# Patient Record
Sex: Female | Born: 1980 | Marital: Single
Health system: Southern US, Community
[De-identification: ages and names within clinical notes are randomized; demographics above are authoritative.]

## PROBLEM LIST (undated history)

## (undated) DIAGNOSIS — T4145XA Adverse effect of unspecified anesthetic, initial encounter: Secondary | ICD-10-CM

## (undated) DIAGNOSIS — F419 Anxiety disorder, unspecified: Secondary | ICD-10-CM

## (undated) DIAGNOSIS — R638 Other symptoms and signs concerning food and fluid intake: Secondary | ICD-10-CM

## (undated) DIAGNOSIS — T8859XA Other complications of anesthesia, initial encounter: Secondary | ICD-10-CM

## (undated) DIAGNOSIS — S42309A Unspecified fracture of shaft of humerus, unspecified arm, initial encounter for closed fracture: Secondary | ICD-10-CM

## (undated) DIAGNOSIS — N926 Irregular menstruation, unspecified: Secondary | ICD-10-CM

## (undated) DIAGNOSIS — I10 Essential (primary) hypertension: Secondary | ICD-10-CM

## (undated) DIAGNOSIS — O99345 Other mental disorders complicating the puerperium: Secondary | ICD-10-CM

## (undated) DIAGNOSIS — R5383 Other fatigue: Secondary | ICD-10-CM

## (undated) DIAGNOSIS — R87619 Unspecified abnormal cytological findings in specimens from cervix uteri: Secondary | ICD-10-CM

## (undated) DIAGNOSIS — F53 Postpartum depression: Secondary | ICD-10-CM

## (undated) DIAGNOSIS — IMO0002 Reserved for concepts with insufficient information to code with codable children: Secondary | ICD-10-CM

## (undated) DIAGNOSIS — Z8619 Personal history of other infectious and parasitic diseases: Secondary | ICD-10-CM

## (undated) DIAGNOSIS — R102 Pelvic and perineal pain: Secondary | ICD-10-CM

## (undated) DIAGNOSIS — N898 Other specified noninflammatory disorders of vagina: Secondary | ICD-10-CM

## (undated) DIAGNOSIS — Z87440 Personal history of urinary (tract) infections: Secondary | ICD-10-CM

## (undated) HISTORY — DX: Other symptoms and signs concerning food and fluid intake: R63.8

## (undated) HISTORY — DX: Personal history of urinary (tract) infections: Z87.440

## (undated) HISTORY — DX: Personal history of other infectious and parasitic diseases: Z86.19

## (undated) HISTORY — DX: Irregular menstruation, unspecified: N92.6

## (undated) HISTORY — DX: Unspecified abnormal cytological findings in specimens from cervix uteri: R87.619

## (undated) HISTORY — DX: Other specified noninflammatory disorders of vagina: N89.8

## (undated) HISTORY — DX: Pelvic and perineal pain: R10.2

## (undated) HISTORY — PX: TUBAL LIGATION: SHX77

## (undated) HISTORY — DX: Essential (primary) hypertension: I10

## (undated) HISTORY — DX: Anxiety disorder, unspecified: F41.9

## (undated) HISTORY — DX: Unspecified fracture of shaft of humerus, unspecified arm, initial encounter for closed fracture: S42.309A

## (undated) HISTORY — PX: WISDOM TOOTH EXTRACTION: SHX21

## (undated) HISTORY — DX: Other fatigue: R53.83

## (undated) HISTORY — PX: OTHER SURGICAL HISTORY: SHX169

## (undated) HISTORY — DX: Other mental disorders complicating the puerperium: O99.345

## (undated) HISTORY — DX: Reserved for concepts with insufficient information to code with codable children: IMO0002

## (undated) HISTORY — PX: TONSILLECTOMY: SUR1361

## (undated) HISTORY — DX: Other complications of anesthesia, initial encounter: T88.59XA

## (undated) HISTORY — DX: Adverse effect of unspecified anesthetic, initial encounter: T41.45XA

## (undated) HISTORY — DX: Postpartum depression: F53.0

---

## 1999-01-10 ENCOUNTER — Encounter: Payer: Self-pay | Admitting: General Surgery

## 1999-01-10 ENCOUNTER — Inpatient Hospital Stay (HOSPITAL_COMMUNITY): Admission: EM | Admit: 1999-01-10 | Discharge: 1999-01-12 | Payer: Self-pay | Admitting: Emergency Medicine

## 1999-01-19 ENCOUNTER — Ambulatory Visit (HOSPITAL_BASED_OUTPATIENT_CLINIC_OR_DEPARTMENT_OTHER): Admission: RE | Admit: 1999-01-19 | Discharge: 1999-01-19 | Payer: Self-pay | Admitting: Orthopedic Surgery

## 1999-03-22 ENCOUNTER — Encounter: Admission: RE | Admit: 1999-03-22 | Discharge: 1999-06-20 | Payer: Self-pay | Admitting: Orthopedic Surgery

## 2002-08-19 ENCOUNTER — Encounter: Payer: Self-pay | Admitting: *Deleted

## 2002-08-19 ENCOUNTER — Inpatient Hospital Stay (HOSPITAL_COMMUNITY): Admission: AD | Admit: 2002-08-19 | Discharge: 2002-08-19 | Payer: Self-pay | Admitting: *Deleted

## 2002-08-21 ENCOUNTER — Inpatient Hospital Stay (HOSPITAL_COMMUNITY): Admission: AD | Admit: 2002-08-21 | Discharge: 2002-08-21 | Payer: Self-pay | Admitting: *Deleted

## 2002-10-06 ENCOUNTER — Ambulatory Visit (HOSPITAL_COMMUNITY): Admission: RE | Admit: 2002-10-06 | Discharge: 2002-10-06 | Payer: Self-pay | Admitting: *Deleted

## 2002-12-23 ENCOUNTER — Ambulatory Visit (HOSPITAL_COMMUNITY): Admission: RE | Admit: 2002-12-23 | Discharge: 2002-12-23 | Payer: Self-pay | Admitting: *Deleted

## 2002-12-23 ENCOUNTER — Encounter: Payer: Self-pay | Admitting: *Deleted

## 2003-02-10 ENCOUNTER — Inpatient Hospital Stay (HOSPITAL_COMMUNITY): Admission: AD | Admit: 2003-02-10 | Discharge: 2003-02-10 | Payer: Self-pay | Admitting: *Deleted

## 2003-03-10 ENCOUNTER — Inpatient Hospital Stay (HOSPITAL_COMMUNITY): Admission: AD | Admit: 2003-03-10 | Discharge: 2003-03-10 | Payer: Self-pay | Admitting: *Deleted

## 2003-03-12 ENCOUNTER — Inpatient Hospital Stay (HOSPITAL_COMMUNITY): Admission: AD | Admit: 2003-03-12 | Discharge: 2003-03-12 | Payer: Self-pay | Admitting: Obstetrics & Gynecology

## 2003-03-12 ENCOUNTER — Encounter: Payer: Self-pay | Admitting: *Deleted

## 2003-03-13 ENCOUNTER — Inpatient Hospital Stay (HOSPITAL_COMMUNITY): Admission: AD | Admit: 2003-03-13 | Discharge: 2003-03-16 | Payer: Self-pay | Admitting: Family Medicine

## 2003-07-25 ENCOUNTER — Emergency Department (HOSPITAL_COMMUNITY): Admission: EM | Admit: 2003-07-25 | Discharge: 2003-07-25 | Payer: Self-pay | Admitting: Emergency Medicine

## 2006-09-04 DIAGNOSIS — R5383 Other fatigue: Secondary | ICD-10-CM

## 2006-09-04 DIAGNOSIS — N926 Irregular menstruation, unspecified: Secondary | ICD-10-CM

## 2006-09-04 HISTORY — DX: Irregular menstruation, unspecified: N92.6

## 2006-09-04 HISTORY — DX: Other fatigue: R53.83

## 2006-10-16 ENCOUNTER — Inpatient Hospital Stay (HOSPITAL_COMMUNITY): Admission: AD | Admit: 2006-10-16 | Discharge: 2006-10-16 | Payer: Self-pay | Admitting: Obstetrics and Gynecology

## 2007-02-22 ENCOUNTER — Inpatient Hospital Stay (HOSPITAL_COMMUNITY): Admission: AD | Admit: 2007-02-22 | Discharge: 2007-02-26 | Payer: Self-pay | Admitting: Obstetrics and Gynecology

## 2007-02-24 ENCOUNTER — Encounter (INDEPENDENT_AMBULATORY_CARE_PROVIDER_SITE_OTHER): Payer: Self-pay | Admitting: Obstetrics and Gynecology

## 2007-02-26 ENCOUNTER — Inpatient Hospital Stay (HOSPITAL_COMMUNITY): Admission: AD | Admit: 2007-02-26 | Discharge: 2007-02-26 | Payer: Self-pay | Admitting: Obstetrics and Gynecology

## 2007-04-05 ENCOUNTER — Ambulatory Visit (HOSPITAL_COMMUNITY): Admission: RE | Admit: 2007-04-05 | Discharge: 2007-04-05 | Payer: Self-pay | Admitting: Obstetrics and Gynecology

## 2007-09-05 DIAGNOSIS — N898 Other specified noninflammatory disorders of vagina: Secondary | ICD-10-CM

## 2007-09-05 HISTORY — DX: Other specified noninflammatory disorders of vagina: N89.8

## 2011-01-17 NOTE — H&P (Signed)
NAMEDORTHY, MAGNUSSEN NO.:  [id number]   MEDICAL RECORD NO.:  [id number]          PATIENT TYPE:  AMB   LOCATION:  SDC                           F[facility]ILITY:  [facility]   PHYSICIAN:  Janine Limbo, M.D.DATE OF BIRTH:  1981-03-24   DATE OF ADMISSION:  04/05/2007  DATE OF DISCHARGE:                              HISTORY & PHYSICAL   HISTORY OF PRESENT ILLNESS:  [name] is a 30 year old female, now  para 1-1-0-2, who presents for laparoscopic tubal cautery.  The patient  has been followed at the [facility]  division of [facility].  The patient had a vaginal  delivery at [redacted] weeks gestation on February 24, 2007.  She would like to  proceed with sterilization.   PAST MEDICAL HISTORY:  1. The patient has a history of obesity.  2. She had her wisdom teeth removed at age 56.  3. She had her tonsils removed at age 39.  4. She had a broken arm at age 50 that was repaired.  5. She had TMJ issues at age 51.   DRUG ALLERGIES:  No known drug allergies.   SOCIAL HISTORY:  The patient denies cigarette use, alcohol use and  recreational drug use.   REVIEW OF SYSTEMS:  Noncontributory.   FAMILY HISTORY:  Noncontributory.   PHYSICAL EXAMINATION:  VITAL SIGNS:  Height is 5 feet, 2 inches.  Weight  is 202 pounds.  HEENT:  Within normal limits.  CHEST:  Clear.  HEART:  Regular rate and rhythm.  BREASTS:  Without masses.  ABDOMEN:  Nontender.  EXTREMITIES:  Grossly normal.  NEUROLOGIC:  Grossly normal.  PELVIC:  External genitalia is normal.  Vagina is normal.  Cervix  nontender.  Uterus is normal size, shape and consistency.  Adnexa - no  masses.   ASSESSMENT:  1. Desires sterilization.  2. Obesity.   PLAN:  The patient will undergo a laparoscopy tubal cautery.  She  understands the indications for her surgical procedure, and she accepts  the risk of, but not limited to, anesthetic complications, bleeding,  infections,  possible damage to the surrounding organs and possible tubal  failure (24 per 1000).      Janine Limbo, M.D.  Electronically Signed     AVS/MEDQ  D:  04/04/2007  T:  04/04/2007  Job:  [id number]

## 2011-01-17 NOTE — H&P (Signed)
NAMEKEMANI, [name] NO.:  [id number]   MEDICAL RECORD NO.:  [id number]          PATIENT TYPE:  INP   LOCATION:  9161                          [facility]ILITY:  [facility]   PHYSICIAN:  Janine Limbo, M.D.DATE OF BIRTH:  1981/07/29   DATE OF ADMISSION:  02/22/2007  DATE OF DISCHARGE:                              HISTORY & PHYSICAL   [name] is a 30 year old gravida 2, para-1-0-0-1 at 28-2/7 weeks by  first trimester ultrasound who presented with a gush of fluid at  approximately 8:30 a.m. with ongoing leak since.  She denies any  definitive UCs, but does have some back pain.  No recent intercourse or  trauma.  Does report positive fetal movement, and fluid is clear.  Pregnancy has been normal for her.:   1. Elevated BMI.  2. Questionable LMP.  3. History of irregular cycles.   PRENATAL LABORATORIES:  Blood type A positive.  Rh antibody negative,  VDRL nonreactive, rubella titer positive, hepatitis B surface antigen  negative, HIV nonreactive.  GC and chlamydia cultures were negative.  Pap was normal in March.  Cystic fibrosis testing was declined.  Hemoglobin upon entering the practice was 13.9.  It was within normal  limits at 27 weeks.  The patient declined first semester screen,  quadruple screen, and cystic fibrosis testing.  Glucola was done this  week and was normal.  EDC of May 16, 2007 was established by first  trimester ultrasound secondary to questionable LMP.   HISTORY OF PRESENT PREGNANCY:  The patient entered care at approximately  13 weeks.  She had had an ultrasound in her first trimester.  She was  treated for BV at that time.  She declined first trimester screen,  quadruple screen, and cystic fibrosis.  She had an ultrasound at 18  weeks with limited anatomy noted.  Placenta was normal.  Cervix was 3.25  cm long.  She had another followup anatomy at two weeks subsequent to  that with full anatomic visualization.  She was having some  stressors at  20 weeks with some infidelity.  She also had some family issues.  She  was placed on Vistaril 50 mg q.i.d. just for anxiety.  She was also  treated for UTI at 23 weeks.  She had her Glucola earlier this week, and  that was normal.   OBSTETRICAL HISTORY:  In 2004 the patient had a vaginal birth of a  female infant, weight 6 pounds 7 ounces at 41-1/2 weeks.  She was in  labor 27 hours.  She did have some issues with retained placenta.  The  patient did have a fever during her labor.  She did have some baby blues  for a few days.   PAST MEDICAL HISTORY:  She reports the usual childhood illnesses.  She  had an occasional bladder infection as a child.  She had a broken left  arm at age 33 and had a steel rod placed.  Other surgeries include  wisdom teeth at age 66, tonsils at age 36.  She did have a fetal  bradycardia following the epidural of her last  baby.  The patient did  develop a fever.  Her only other hospitalizations were for childbirth.   ALLERGIES:  She has no known medication allergies.   FAMILY HISTORY:  Her mother is hypertensive on medication.  Her father  is also hypertensive.  Her mother is diabetic on oral medications.  The  patient's daughter had some thyroid trouble.  The patient's first cousin  had seizures.  Genetic history is unremarkable.   SOCIAL HISTORY:  The patient is married to the father of the baby.  He  is currently not present with her.  There have been some issues in their  relationship during this pregnancy.  The patient is accompanied right  now by her mother and sister.  The patient is Caucasian of the [place]  faith.  She is high school educated.  She is employed as a Immunologist.  Her husband has a high school education.  He is currently a Consulting civil engineer.  She has been followed by the physician service [facility].  She  denies any alcohol, drug or tobacco use during this pregnancy.   PHYSICAL EXAMINATION:  VITAL SIGNS:  Stable.  The  patient is afebrile.  HEENT:  Within normal limits.  LUNGS:  Breath sounds are clear.  HEART:  Regular rate and rhythm without murmur.  BREASTS:  Soft and nontender.  ABDOMEN:  Fundal height is approximately 29 cm.  Abdomen is soft and  nontender.  Fetal heart rate was reactive with no decelerations.  There  was mild irritability noted.  Sterile speculum exam reveals positive  pooling, positive nitrazine, positive fern.  Cervix visually appears  closed and long with clear fluid noted.  Fetus was vertex to a bedside  ultrasound.  EXTREMITIES:  Deep tendon reflexes are 2+ without clubbing.  There is  trace edema noted.   IMPRESSION:  1. Intrauterine pregnancy at 28-2/7 weeks.  2. Premature rupture of membranes.   PLAN:  1. Admit to birthing suite for consult with Dr. Marline Backbone who is      attending physician.  2. IV of LR .  3. Penicillin per TBS protocol.  4. Ultrasound.  5. Betamethasone course.  6. NICU consult.  7. Risks of prematurity and premature birth were reviewed with the      patient and her family.  Questions were answered, and NICU consult      was ordered for further information.      Renaldo Reel Emilee Hero, C.N.M.      Janine Limbo, M.D.  Electronically Signed    VLL/MEDQ  D:  02/22/2007  T:  02/22/2007  Job:  [id number]

## 2011-01-17 NOTE — Op Note (Signed)
NAMECHANDELL, [name]   MEDICAL RECORD NO.:  [id number]          PATIENT TYPE:  AMB   LOCATION:  SDC                           [facility]   PHYSICIAN:  Janine Limbo, M.D.DATE OF BIRTH:  1981/04/25   DATE OF PROCEDURE:  04/05/2007  DATE OF DISCHARGE:                               OPERATIVE REPORT   PREOPERATIVE DIAGNOSES:  1. Desires sterilization.  2. Obesity (weight is 202 pounds; height is 5 feet 2 inches).   POSTOPERATIVE DIAGNOSIS:  1. Desires sterilization.  2. Obesity (weight is 202 pounds; height is 5 feet 2 inches).   PROCEDURE:  Laparoscopic tubal cautery.   SURGEON:  Dr. Leonard Schwartz.   FIRST ASSISTANT:  None.   ANESTHETIC:  Is general.   DISPOSITION:  Vickie [name] is a 30 year old female, para 1-1-0-2, who  presents with the above-mentioned diagnoses.  She understands the  indications for her surgical procedure, and she accepts the risks of,  but not limited to, anesthetic complications, bleeding, infection,  possible damage to surrounding organs, and possible tubal failure (24  per 1000).   FINDINGS:  The uterus, fallopian tubes and ovaries were normal.  The  upper abdomen appeared normal as best we could tell.  There was a large  amount of adipose tissue, so it was difficult to visualize most of the  upper abdomen.  There was no damage that we could appreciate to the  bowel or other vital pelvic structures.   PROCEDURE:  The patient was taken to the operating room where a general  anesthetic was given.  The patient's abdomen, perineum and vagina were  prepped with multiple layers of Betadine.  The bladder was drained of  urine.  Examination under anesthesia was performed.  A Hulka tenaculum  was placed inside the uterus.  The patient was sterilely draped.  The  subumbilical area was injected with 5 mL of half percent Marcaine with  epinephrine.  An incision was made, and the Veress needle was  inserted  into the abdominal cavity without difficulty.  Proper placement was  confirmed using the saline drop test.  A pneumoperitoneum was obtained.  The laparoscopic trocar and laparoscope were substituted for the Veress  needle.  The pelvic structures were visualized with findings as  mentioned above.  The right fallopian tube was identified and followed  to its fimbriated end.  The proximal portion of the right fallopian tube  was cauterized in several segments using the bipolar cautery.  Hemostasis was noted to be adequate.  An identical procedure was carried  out on the opposite side.  The pelvis was inspected for a final time,  and then the pneumoperitoneum was allowed to escape.  The trocar was  removed.  The fascia was closed at the umbilicus using 0 Vicryl.  The  skin was reapproximated using 3-0 Monocryl.  Sponge, needle and  instrument counts were correct on 2 occasions.  The estimated blood loss  was less than 5 mL.  The patient tolerated her procedure well.  She was  awakened from her anesthetic without difficulty and taken to  the  recovery room in stable condition.   FOLLOW-UP INSTRUCTIONS:  The patient will return to see Dr. Stefano Gaul in  2-3 weeks for follow-up examination.  She was given a copy of the  postoperative instruction sheet as prepared by the [facility] for patients who have undergone laparoscopy.  The patient was  given Motrin, and she will take 600 mg every 6 hours as needed for mild  to moderate pain.  She will take Vicodin 1 or 2 tablets every 4 hours as  needed for severe pain.  She will call for questions or concerns.      Janine Limbo, M.D.  Electronically Signed     AVS/MEDQ  D:  04/05/2007  T:  04/05/2007  Job:  [id number]

## 2011-01-17 NOTE — Discharge Summary (Signed)
Vickie [name], Vickie [name] NO.:  [id number]   MEDICAL RECORD NO.:  [id number]          PATIENT TYPE:  INP   LOCATION:  9304                          [facility]ILITY:  [facility]   PHYSICIAN:  Janine Limbo, M.D.DATE OF BIRTH:  May 15, 1981   DATE OF ADMISSION:  02/22/2007  DATE OF DISCHARGE:  02/26/2007                               DISCHARGE SUMMARY   ADMISSION DIAGNOSIS:  1. Intrauterine pregnancy at 28-2/7 weeks.  2. Premature rupture of membranes.   DISCHARGE DIAGNOSES:  1. Intrauterine pregnancy at 28-2/7 weeks.  2. Premature rupture of membranes.  3. Preterm labor and spontaneous vaginal delivery of a preterm infant.      This preterm female infant named Vickie [name], Apgars 3 and 7, weighing 2      pounds 7 ounces who was taken to the NICU.   HOSPITAL PROCEDURES:  1. Electronic fetal monitoring.  2. IV antibiotics.  3. Ultrasound.  4. Betamethasone course.  5. Spontaneous vaginal delivery.  6. Epidural anesthesia.   HOSPITAL COURSE:  The patient was admitted with preterm premature  rupture of membranes. Cervix at that time was closed and long. She was  given betamethasone course, placed on penicillin and ultrasound was done  showing 62 percentile growth and she was placed on observation. The next  day she was doing well, still leaking fluid, not having any  contractions, but later that evening developed some increased pressure  and contractions but declined tocolysis. The next morning she developed  variable decelerations of fetal heart rate. She had some sporadic  contractions and she received her second dose of betamethasone in the  afternoon of February 24, 2007. She developed pressure. Cervix was beginning  to thin out and she delivered that evening of a female infant named Vickie [name]  weighing 2 pounds 7 ounces, Apgars three and seven, estimated blood loss  250 mL.  Baby was taken to NICU and the patient was taken to the women's  unit. On postpartum day #1 she was doing well.   She was bottle-feeding  her infant.  Baby was doing well in the NICU.  She received routine  postpartum care.  Hemoglobin was 10.8. On postpartum day #2 she was  ready to go home.  Vital signs were stable.  Chest was clear.  Heart  rate regular rate and rhythm.  Fundus firm.  Lochia within normal  limits.  Perineum intact.  Extremities within normal limits.  She was  deemed to have received full benefit of hospital stay and was discharged  home.   DISCHARGE MEDICATIONS:  Motrin 600 mg p.o. q.6 hours p.r.n.   DISCHARGE LABS:  White blood cell count 19.2, hemoglobin 10.8, platelets  200.   DISCHARGE INSTRUCTIONS:  Per [facility] handout.  Discharge follow up in 6  weeks or p.r.n.   CONDITION ON DISCHARGE:  Good.      Marie L. Williams, C.N.M.      Janine Limbo, M.D.  Electronically Signed    MLW/MEDQ  D:  02/26/2007  T:  02/26/2007  Job:  [id number]

## 2011-01-23 DIAGNOSIS — IMO0002 Reserved for concepts with insufficient information to code with codable children: Secondary | ICD-10-CM

## 2011-01-23 DIAGNOSIS — R102 Pelvic and perineal pain: Secondary | ICD-10-CM

## 2011-01-23 HISTORY — DX: Reserved for concepts with insufficient information to code with codable children: IMO0002

## 2011-01-23 HISTORY — DX: Pelvic and perineal pain: R10.2

## 2011-06-19 LAB — CBC
HCT: 41.7
MCHC: 33.6
MCV: 89.7
Platelets: 297
RDW: 13.4

## 2011-06-19 LAB — URINALYSIS, ROUTINE W REFLEX MICROSCOPIC
Bilirubin Urine: NEGATIVE
Ketones, ur: NEGATIVE
Specific Gravity, Urine: >1.03 — ABNORMAL HIGH
Urobilinogen, UA: 1

## 2011-06-19 LAB — URINE MICROSCOPIC-ADD ON

## 2011-06-21 LAB — STREP B DNA PROBE: Strep Group B Ag: POSITIVE

## 2011-06-21 LAB — CBC
HCT: 39.3
MCHC: 34.7
MCV: 91.1
Platelets: 246
RBC: 3.4 — ABNORMAL LOW
RBC: 3.92
RDW: 13.2
RDW: 13.2
WBC: 14.9 — ABNORMAL HIGH

## 2011-06-21 LAB — URINALYSIS, ROUTINE W REFLEX MICROSCOPIC
Glucose, UA: NEGATIVE
Specific Gravity, Urine: 1.025

## 2011-06-21 LAB — URINE CULTURE

## 2011-06-21 LAB — DIFFERENTIAL
Lymphs Abs: 1.8
Monocytes Relative: 6
Neutro Abs: 12.1 — ABNORMAL HIGH
Neutrophils Relative %: 81 — ABNORMAL HIGH

## 2011-06-21 LAB — GC/CHLAMYDIA PROBE AMP, GENITAL: Chlamydia, DNA Probe: NEGATIVE

## 2011-06-21 LAB — URINE MICROSCOPIC-ADD ON

## 2012-03-22 ENCOUNTER — Encounter: Payer: Self-pay | Admitting: Obstetrics and Gynecology

## 2012-03-22 ENCOUNTER — Telehealth: Payer: Self-pay

## 2012-03-22 ENCOUNTER — Ambulatory Visit (INDEPENDENT_AMBULATORY_CARE_PROVIDER_SITE_OTHER): Payer: BC Managed Care – PPO | Admitting: Obstetrics and Gynecology

## 2012-03-22 VITALS — BP 120/82 | Resp 16 | Ht 62.0 in | Wt 216.0 lb

## 2012-03-22 DIAGNOSIS — T83511A Infection and inflammatory reaction due to indwelling urethral catheter, initial encounter: Secondary | ICD-10-CM

## 2012-03-22 DIAGNOSIS — N926 Irregular menstruation, unspecified: Secondary | ICD-10-CM

## 2012-03-22 DIAGNOSIS — A499 Bacterial infection, unspecified: Secondary | ICD-10-CM

## 2012-03-22 DIAGNOSIS — N390 Urinary tract infection, site not specified: Secondary | ICD-10-CM

## 2012-03-22 DIAGNOSIS — B9689 Other specified bacterial agents as the cause of diseases classified elsewhere: Secondary | ICD-10-CM | POA: Insufficient documentation

## 2012-03-22 DIAGNOSIS — N760 Acute vaginitis: Secondary | ICD-10-CM

## 2012-03-22 DIAGNOSIS — T83518A Infection and inflammatory reaction due to other urinary catheter, initial encounter: Secondary | ICD-10-CM | POA: Insufficient documentation

## 2012-03-22 LAB — POCT WET PREP (WET MOUNT)
Trichomonas Wet Prep HPF POC: NEGATIVE
WBC, Wet Prep HPF POC: NEGATIVE
pH: 4.5

## 2012-03-22 LAB — POCT URINALYSIS DIPSTICK
Blood, UA: 3
Ketones, UA: NEGATIVE
Spec Grav, UA: 1.02
pH, UA: 7

## 2012-03-22 NOTE — Progress Notes (Addendum)
When did bleeding start: 1 wk ago How  Long: current  How often changing pad/tampon: none only when she wipes  Bleeding Disorders: no Contraception: no Fibroids: no Hormone Therapy: no New Medications: no Menopausal Symptoms: no Vag. Discharge: no Abdominal Pain: yes Increased Stress: no  *possible uti took AZO relieves but sx comes right back    S: Patient presented with symptoms of UTI and describes vaginal spotting and irritation (States prone to UTIs) O: Lungs: Bilat clear      CV: RRR      Abdomen: Soft, and non tender all Quadrants      External Genitalia: normal no swelling or tenderness      Speculum: slight irritation of vaginal walls and noted pink blood in vault and cx surface. Cx well perfused with no signs of irritation.      Wet Prep:  PH 4.0 , pos+ whiff, Clue cells.      Urine: for C&S A:  Cystitis associated with BV and BV P. BV - Tx with Flagyl 500mg s po BID x 7 days     Cystitis - Tx Macrobid 100mg s po BID x 7 days     Vagisil  Cream to external labia for comfort     Cranberry Tablets po daily for Urinary tract maintenance.     To return for Annual - to schedule or PRN                              Vickie [name],CNM.

## 2012-03-22 NOTE — Telephone Encounter (Signed)
Rx called into pt [facility] @ 10:18am as stated in progress note. Flagyl 500 mg PO BID x 7days 0 RF's Macrobid 100mg  PO BID x 7days 0 RF's AZO cranberry tablets PO daily dis #30 tab RF 's X11 Vagisil Cream disp 1 tube 0 RF's apply to labia PRN.  [facility] CMA

## 2012-03-23 LAB — GC/CHLAMYDIA PROBE AMP, GENITAL
Chlamydia, DNA Probe: NEGATIVE
GC Probe Amp, Genital: NEGATIVE

## 2012-03-24 LAB — URINE CULTURE

## 2012-04-02 ENCOUNTER — Telehealth: Payer: Self-pay | Admitting: Obstetrics and Gynecology

## 2012-04-02 NOTE — Telephone Encounter (Signed)
Telephone call from patient.  Concern re symptoms of UTI - no pregnant  Ongoing Symptoms since tx 03/22/12 (Macrobid) Urine culture result reviewed from 03/18/12 - sensitive to Bactrim DS  Tx Bactrim DS po BID  X 7 days and Pyridrium  po TID PRN ( no Refills) Called in Order  For patient - [facility]  Patient to follow up if Symptoms do not resolve.  Earl Gala, CNM/Vicki Emilee Hero, CNM.

## 2012-05-03 ENCOUNTER — Ambulatory Visit: Payer: BC Managed Care – PPO | Admitting: Obstetrics and Gynecology

## 2012-05-29 ENCOUNTER — Telehealth: Payer: Self-pay | Admitting: Obstetrics and Gynecology

## 2012-05-29 NOTE — Telephone Encounter (Signed)
VM from pt RX line. Requesting RX for UTI. Saw midwife previously and discussed standing order. PT [phone]

## 2012-05-29 NOTE — Telephone Encounter (Signed)
Lm on vm to cb per telephone call.  

## 2014-02-12 ENCOUNTER — Other Ambulatory Visit: Payer: Self-pay | Admitting: Obstetrics and Gynecology

## 2014-02-23 ENCOUNTER — Other Ambulatory Visit: Payer: Self-pay | Admitting: Obstetrics and Gynecology

## 2014-07-06 ENCOUNTER — Encounter: Payer: Self-pay | Admitting: Obstetrics and Gynecology

## 2015-02-09 LAB — LIPID PANEL
Cholesterol: 216 — AB (ref 0–200)
HDL: 41 (ref 35–70)
LDL Cholesterol: 142
Triglycerides: 167 — AB (ref 40–160)

## 2015-02-09 LAB — CBC AND DIFFERENTIAL
HCT: 43 (ref 36–46)
HEMOGLOBIN: 14.8 (ref 12.0–16.0)
PLATELETS: 290 (ref 150–399)
WBC: 9.6

## 2015-02-09 LAB — BASIC METABOLIC PANEL
BUN: 12 (ref 4–21)
CREATININE: 0.7 (ref <=1.1)
GLUCOSE: 98
POTASSIUM: 4.4 (ref 3.4–5.3)
Sodium: 141 (ref 137–147)

## 2015-02-09 LAB — HEMOGLOBIN A1C: Hemoglobin A1C: 5.7

## 2015-02-09 LAB — TSH: TSH: 2.59 (ref <=5.90)

## 2015-02-10 LAB — HM PAP SMEAR: HM Pap smear: NORMAL

## 2016-05-17 ENCOUNTER — Ambulatory Visit (HOSPITAL_COMMUNITY)
Admission: RE | Admit: 2016-05-17 | Discharge: 2016-05-17 | Disposition: A | Discharge status: Home / Self Care | Payer: No Typology Code available for payment source | Source: Ambulatory Visit | Attending: Family Medicine | Admitting: Family Medicine

## 2016-05-17 ENCOUNTER — Other Ambulatory Visit: Payer: Self-pay | Admitting: Family Medicine

## 2016-05-17 ENCOUNTER — Encounter (HOSPITAL_COMMUNITY): Payer: Self-pay

## 2016-05-17 DIAGNOSIS — R1032 Left lower quadrant pain: Secondary | ICD-10-CM | POA: Diagnosis not present

## 2016-05-17 DIAGNOSIS — K760 Fatty (change of) liver, not elsewhere classified: Secondary | ICD-10-CM | POA: Diagnosis not present

## 2016-05-25 LAB — HEPATIC FUNCTION PANEL
ALT: 24 (ref 7–35)
AST: 20 (ref 13–35)
Alkaline Phosphatase: 79 (ref 25–125)
BILIRUBIN, TOTAL: 0.4

## 2016-05-25 LAB — HM PAP SMEAR: HM Pap smear: NEGATIVE

## 2017-03-20 ENCOUNTER — Encounter: Payer: Self-pay | Admitting: Family Medicine

## 2017-03-20 ENCOUNTER — Ambulatory Visit: Payer: No Typology Code available for payment source | Admitting: Family Medicine

## 2017-03-20 VITALS — BP 129/86 | HR 84 | Ht 62.25 in | Wt 253.1 lb

## 2017-03-20 DIAGNOSIS — R748 Abnormal levels of other serum enzymes: Secondary | ICD-10-CM

## 2017-03-20 DIAGNOSIS — E66813 Obesity, class 3: Secondary | ICD-10-CM | POA: Insufficient documentation

## 2017-03-20 DIAGNOSIS — Z0000 Encounter for general adult medical examination without abnormal findings: Secondary | ICD-10-CM

## 2017-03-20 DIAGNOSIS — M791 Myalgia, unspecified site: Secondary | ICD-10-CM

## 2017-03-20 DIAGNOSIS — M2550 Pain in unspecified joint: Secondary | ICD-10-CM

## 2017-03-20 DIAGNOSIS — R0683 Snoring: Secondary | ICD-10-CM

## 2017-03-20 DIAGNOSIS — I10 Essential (primary) hypertension: Secondary | ICD-10-CM

## 2017-03-20 DIAGNOSIS — Z8739 Personal history of other diseases of the musculoskeletal system and connective tissue: Secondary | ICD-10-CM

## 2017-03-20 DIAGNOSIS — R609 Edema, unspecified: Secondary | ICD-10-CM

## 2017-03-20 NOTE — Progress Notes (Signed)
New patient office visit note:  Impression and Recommendations:    1. Obesity, Class III, BMI 40-49.9 (morbid obesity) (HCC)   2. HTN, goal below 130/80   3. History of chronic back pain- MRI done in past   4. Peripheral edema   5. Snoring- terrible   6. Elevated liver enzymes   7. gen Myalgias   8. Arthralgia, unspecified joint   9. Routine adult health maintenance      No problem-specific Assessment & Plan notes found for this encounter.   The patient was counseled, risk factors were discussed, anticipatory guidance given.   New Prescriptions   No medications on file     Discontinued Medications   No medications on file      Orders Placed This Encounter  Procedures  . CBC with Differential/Platelet  . Comprehensive metabolic panel  . Hemoglobin A1c  . Lipid panel  . TSH  . T4, free  . VITAMIN D 25 Hydroxy (Vit-D Deficiency, Fractures)  . Magnesium  . Phosphorus     Gross side effects, risk and benefits, and alternatives of medications discussed with patient.  Patient is aware that all medications have potential side effects and we are unable to predict every side effect or drug-drug interaction that may occur.  Expresses verbal understanding and consents to current therapy plan and treatment regimen.  Return for Please follow-up 2 weeks or so for review of blood work and eval of chronic back pain.  Please see AVS handed out to patient at the end of our visit for further patient instructions/ counseling done pertaining to today's office visit.    Note: This document was prepared using Dragon voice recognition software and may include unintentional dictation errors.  ----------------------------------------------------------------------------------------------------------------------    Subjective:     HPI: Vickie [name] is a pleasant 36 y.o. female who presents to [facility] today to review their medical  history with me and establish care.   I asked the patient to review their chronic problem list with me to ensure everything was updated and accurate.    All recent office visits with other providers, any medical records that patient brought in etc  - I reviewed today.     Also asked pt to get me medical records from [facility] providers/ specialists that they had seen within the past 3-5 years- if they are in private practice and/or do not work for a [employer], [facility], [facility], [facility] or [facility] owned practice.  Told them to call their specialists to clarify this if they are not sure.   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     Works full time at [employer]- 40-50hr /wk;  [place] here. Close to parents- they live her.  Sister- close to her as well.   Married 5 kids- 2 step kids and 3 of her own.  Some martial discourse and differing opinions on how to reach children.  They've been married for 5 years.  Obese:  She is the heaviest she has ever been.   She used [employer] and lost 50lbs about 4 yrs ago or so.     Problem  Htn, Goal Below 130/80   About one yr has had HTN.   AT home- bp measurements are off and really high.  No sx.     History of chronic back pain- MRI done in past   MRI about 15 years ago.  Has seen specialists.  Has even  woken up in the middle the night due to back pain. Never had PT.     Peripheral Edema  Snoring- terrible  Obesity, Class III, Bmi 40-49.9 (Morbid Obesity) (Hcc)  Elevated Liver Enzymes  Myalgia  Arthralgia   Bilateral feet, knees, back, neck, and heels- chronic       Wt Readings from Last 3 Encounters:  03/20/17 253 lb 1.6 oz (114.8 kg)  03/22/12 216 lb (98 kg)   BP Readings from Last 3 Encounters:  03/20/17 129/86  03/22/12 120/82   Pulse Readings from Last 3 Encounters:  03/20/17 84   BMI Readings from Last 3 Encounters:  03/20/17 45.92 kg/m  03/22/12 39.51 kg/m    Patient Care Team    Relationship Specialty Notifications Start End    [name], DO PCP - General Family Medicine  03/20/17     Patient Active Problem List   Diagnosis Date Noted  . HTN, goal below 130/80 03/20/2017  . History of chronic back pain- MRI done in past 03/20/2017  . Peripheral edema 03/20/2017  . Snoring- terrible 03/20/2017  . Obesity, Class III, BMI 40-49.9 (morbid obesity) (HCC) 03/20/2017  . Elevated liver enzymes 03/20/2017  . Myalgia 03/20/2017  . Arthralgia 03/20/2017  . BV (bacterial vaginosis) 03/22/2012  . Urinary catheter infection (HCC) 03/22/2012     Past Medical History:  Diagnosis Date  . Abnormal Pap smear   . Anxiety   . Broken arm    Left   . Complication of anesthesia    Epidural- baby's HR decreased ; developed  fever  . Fatigue 2008  . H/O dyspareunia 01/23/11  . H/O varicella   . Hx of candidiasis   . Hx: UTI (urinary tract infection)   . Hypertension   . Increased BMI   . Irregular periods/menstrual cycles 2008  . Postpartum depression   . Vaginal lesion 2009    Painful  . Vaginal pain 01/23/11     Past Medical History:  Diagnosis Date  . Abnormal Pap smear   . Anxiety   . Broken arm    Left   . Complication of anesthesia    Epidural- baby's HR decreased ; developed  fever  . Fatigue 2008  . H/O dyspareunia 01/23/11  . H/O varicella   . Hx of candidiasis   . Hx: UTI (urinary tract infection)   . Hypertension   . Increased BMI   . Irregular periods/menstrual cycles 2008  . Postpartum depression   . Vaginal lesion 2009    Painful  . Vaginal pain 01/23/11     Past Surgical History:  Procedure Laterality Date  . repair broken arm    . TONSILLECTOMY     Age 75  . TUBAL LIGATION    . WISDOM TOOTH EXTRACTION       Family History  Problem Relation Age of Onset  . Hypertension Mother   . Diabetes Mother   . Cancer Mother        skin  . Leukemia Mother   . Diabetes Father   . Hypertension Father   . COPD Father   . Stroke Maternal Grandmother      History  Drug Use No      History  Alcohol Use  . 0.5 oz/week  . 1 Standard drinks or equivalent per week    Comment: social     History  Smoking Status  . Never Smoker  Smokeless Tobacco  . Never Used     Outpatient Encounter Prescriptions  as of 03/20/2017  Medication Sig  . aspirin (BAYER LOW DOSE) 81 MG EC tablet Take 81 mg by mouth daily. Swallow whole.  . hydrochlorothiazide (HYDRODIURIL) 12.5 MG tablet   . pantoprazole (PROTONIX) 40 MG tablet    No facility-administered encounter medications on file as of 03/20/2017.     Allergies: Patient has no known allergies.   Review of Systems  Constitutional: Negative for chills, diaphoresis, fever, malaise/fatigue and weight loss.  HENT: Negative for congestion, sore throat and tinnitus.   Eyes: Negative for blurred vision, double vision and photophobia.  Respiratory: Negative for cough and wheezing.   Cardiovascular: Negative for chest pain and palpitations.  Gastrointestinal: Negative for blood in stool, diarrhea, nausea and vomiting.  Genitourinary: Negative for dysuria, frequency and urgency.  Musculoskeletal: Negative for joint pain and myalgias.  Skin: Negative for itching and rash.  Neurological: Negative for dizziness, focal weakness, weakness and headaches.  Endo/Heme/Allergies: Negative for environmental allergies and polydipsia. Does not bruise/bleed easily.  Psychiatric/Behavioral: Negative for depression and memory loss. The patient is not nervous/anxious and does not have insomnia.      Objective:   Blood pressure 129/86, pulse 84, height 5' 2.25" (1.581 m), weight 253 lb 1.6 oz (114.8 kg), last menstrual period 03/13/2017. Body mass index is 45.92 kg/m. General: Well Developed, well nourished, and in no acute distress.  Neuro: Alert and oriented x3, extra-ocular muscles intact, sensation grossly intact.  HEENT:[place]/AT, PERRLA, neck supple, No carotid bruits Skin: no gross rashes  Cardiac: Regular rate and  rhythm Respiratory: Essentially clear to auscultation bilaterally. Not using accessory muscles, speaking in full sentences.  Abdominal: not grossly distended Musculoskeletal: Ambulates w/o diff, FROM * 4 ext.  Vasc: less 2 sec cap RF, warm and pink  Psych:  No HI/SI, judgement and insight good, Euthymic mood. Full Affect.    No results found for this or any previous visit (from the past 2160 hour(s)).

## 2017-03-20 NOTE — Patient Instructions (Addendum)
Next office visit please return to discuss labs as well as your chronic back pain      Life coach- get one!!!    Behavioral Health/Psychiatry Referrals   Corine Shelter, MSW [address] [place] [phone]   [facility] Caralyn Guile, PhD [address] [facility] [phone]  [facility] Psychological [address] [place], [place] [phone]  Heloise Beecham [facility] and [employer] [phone]  [facility] abuse [facility] Manager [address] [place] [phone]  [facility] [address], [place] [phone] Eliott Nine, PhD [place] Scrape, PhD Hollace Kinnier, LCSW Andrena Mews, PhD-child, adolescent and adults  [facility] [address], Ginette Otto [phone] Daun Peacock, MS-child, adolescent and adults Madelaine Etienne, PhD-adolescent and adults  [facility]rief, terminal illness [address] [place], [place] [phone]  [facility] [address], [place] [phone] [facility] [address] [place] [phone]  [facility] [address] [place] [phone]  [facility] [address], [address] [place] Marcus [phone]  [facility] [place] [facility] [address] Pura Spice [phone]  [facility] [address], [place] [phone]  [facility] [address], [place] [phone]     .Behavior Modification Ideas for Weight Management  Weight management involves adopting a healthy lifestyle that includes a knowledge of nutrition and exercise, a positive attitude and the right  kind of motivation. Internal motives such as better health, increased energy, self-esteem and personal control increase your chances of lifelong weight management success.  Remember to have realistic goals and think long-term success. Believe in yourself and you can do it. The following information will give you ideas to help you meet your goals.  Control Your Home Environment  Eat only while sitting down at the kitchen or dining room table. Do not eat while watching television, reading, cooking, talking on the phone, standing at the refrigerator or working on the computer. Keep tempting foods out of the house - don't buy them. Keep tempting foods out of sight. Have low-calorie foods ready to eat. Unless you are preparing a meal, stay out of the kitchen. Have healthy snacks at your disposal, such as small pieces of fruit, vegetables, canned fruit, pretzels, low-fat string cheese and nonfat cottage cheese.  Control Your Work Environment  Do not eat at [employer] or keep tempting snacks at your desk. If you get hungry between meals, plan healthy snacks and bring them with you to work. During your breaks, go for a walk instead of eating. If you work around food, plan in advance the one item you will eat at mealtime. Make it inconvenient to nibble on food by chewing gum, sugarless candy or drinking water or another low-calorie beverage. Do not work through meals. Skipping meals slows down metabolism and may result in overeating at the next meal. If food is available for special occasions, either pick the healthiest item, nibble on low-fat snacks brought from home, don't have anything offered, choose one option and have a small amount, or have only a beverage.  Control Your Mealtime Environment  Serve your plate of food at the stove or kitchen counter. Do not put the serving dishes on the table. If you do put dishes on the table, remove them immediately when finished eating. Fill half of your  plate with  vegetables, a quarter with lean protein and a quarter with starch. Use smaller plates, bowls and glasses. A smaller portion will look large when it is in a little dish. Politely refuse second helpings. When fixing your plate, limit portions of food to one scoop/serving or less.   Daily Food Management  Replace eating with another activity that you will not associate with food. Wait 20 minutes before eating something you are craving. Drink a large glass of water or diet soda before eating. Always have a big glass or bottle of water to drink throughout the day. Avoid high-calorie add-ons such as cream with your coffee, butter, mayonnaise and salad dressings.  Shopping: Do not shop when hungry or tired. Shop from a list and avoid buying anything that is not on your list. If you must have tempting foods, buy individual-sized packages and try to find a lower-calorie alternative. Don't taste test in the store. Read food labels. Compare products to help you make the healthiest choices.  Preparation: Chew a piece of gum while cooking meals. Use a quarter teaspoon if you taste test your food. Try to only fix what you are going to eat, leaving yourself no chance for seconds. If you have prepared more food than you need, portion it into individual containers and freeze or refrigerate immediately. Don't snack while cooking meals.  Eating: Eat slowly. Remember it takes about 20 minutes for your stomach to send a message to your brain that it is full. Don't let fake hunger make you think you need more. The ideal way to eat is to take a bite, put your utensil down, take a sip of water, cut your next bite, take a bit, put your utensil down and so on. Do not cut your food all at one time. Cut only as needed. Take small bites and chew your food well. Stop eating for a minute or two at least once during a meal or snack. Take breaks to reflect and have conversation.  Cleanup and  Leftovers: Label leftovers for a specific meal or snack. Freeze or refrigerate individual portions of leftovers. Do not clean up if you are still hungry.  Eating Out and Social Eating  Do not arrive hungry. Eat something light before the meal. Try to fill up on low-calorie foods, such as vegetables and fruit, and eat smaller portions of the high-calorie foods. Eat foods that you like, but choose small portions. If you want seconds, wait at least 20 minutes after you have eaten to see if you are actually hungry or if your eyes are bigger than your stomach. Limit alcoholic beverages. Try a soda water with a twist of lime. Do not skip other meals in the day to save room for the special event.  At Restaurants: Order  la carte rather than buffet style. Order some vegetables or a salad for an appetizer instead of eating bread. If you order a high-calorie dish, share it with someone. Try an after-dinner mint with your coffee. If you do have dessert, share it with two or more people. Don't overeat because you do not want to waste food. Ask for a doggie bag to take extra food home. Tell the server to put half of your entree in a to go bag before the meal is served to you. Ask for salad dressing, gravy or high-fat sauces on the side. Dip the tip of your fork in the dressing before each bite. If bread is served, ask for only one piece. Try it plain  without butter or oil. At [employer] where oil and vinegar is served with bread, use only a small amount of oil and a lot of vinegar for dipping.  At a Friend's House: Offer to bring a dish, appetizer or dessert that is low in calories. Serve yourself small portions or tell the host that you only want a small amount. Stand or sit away from the snack table. Stay away from the kitchen or stay busy if you are near the food. Limit your alcohol intake.  At [employer] and Cafeterias: Cover most of your plate with lettuce and/or vegetables. Use a  salad plate instead of a dinner plate. After eating, clear away your dishes before having coffee or tea.  Entertaining at Home: Explore low-fat, low-cholesterol cookbooks. Use single-serving foods like chicken breasts or hamburger patties. Prepare low-calorie appetizers and desserts.   Holidays: Keep tempting foods out of sight. Decorate the house without using food. Have low-calorie beverages and foods on hand for guests. Allow yourself one planned treat a day. Don't skip meals to save up for the holiday feast. Eat regular, planned meals.   Exercise Well  Make exercise a priority and a planned activity in the day. If possible, walk the entire or part of the distance to work. Get an exercise buddy. Go for a walk with a colleague during one of your breaks, go to the gym, run or take a walk with a friend, walk in the mall with a shopping companion. Park at the end of the parking lot and walk to the store or office entrance. Always take the stairs all of the way or at least part of the way to your floor. If you have a desk job, walk around the office frequently. Do leg lifts while sitting at your desk. Do something outside on the weekends like going for a hike or a bike ride.   Have a Healthy Attitude  Make health your weight management priority. Be realistic. Have a goal to achieve a healthier you, not necessarily the lowest weight or ideal weight based on calculations or tables. Focus on a healthy eating style, not on dieting. Dieting usually lasts for a short amount of time and rarely produces long-term success. Think long term. You are developing new healthy behaviors to follow next month, in a year and in a decade.    This information is for educational purposes only and is not intended to replace the advice of your doctor or health care provider. We encourage you to discuss with your doctor any questions or concerns you may have.        Guidelines for Losing  Weight   We want weight loss that will last so you should lose 1-2 pounds a week.  THAT IS IT! Please pick THREE things a month to change. Once it is a habit check off the item. Then pick another three items off the list to become habits.  If you are already doing a habit on the list GREAT!  Cross that item off!  Don't drink your calories. Ie, alcohol, soda, fruit juice, and sweet tea.   Drink more water. Drink a glass when you feel hungry or before each meal.   Eat breakfast - Complex carb and protein (likeDannon light and fit yogurt, oatmeal, fruit, eggs, [place] bacon).  Measure your cereal.  Eat no more than one cup a day. (ie Kashi)  Eat an apple a day.  Add a vegetable a day.  Try a new vegetable a  month.  Use Pam! Stop using oil or butter to cook.  Don't finish your plate or use smaller plates.  Share your dessert.  Eat sugar free Jello for dessert or frozen grapes.  Don't eat 2-3 hours before bed.  Switch to whole wheat bread, pasta, and brown rice.  Make healthier choices when you eat out. No fries!  Pick baked chicken, NOT fried.  Don't forget to SLOW DOWN when you eat. It is not going anywhere.   Take the stairs.  Park far away in the parking lot  Lift soup cans (or weights) for 10 minutes while watching TV.  Walk at work for 10 minutes during break.  Walk outside 1 time a week with your friend, kids, dog, or significant other.  Start a walking group at church.  Walk the mall as much as you can tolerate.   Keep a food diary.  Weigh yourself daily.  Walk for 15 minutes 3 days per week.  Cook at home more often and eat out less. If life happens and you go back to old habits, it is okay.  Just start over. You can do it!  If you experience chest pain, get short of breath, or tired during the exercise, please stop immediately and inform your doctor.    Before you even begin to attack a weight-loss plan, it pays to remember this: You are not fat. You  have fat. Losing weight isn't about blame or shame; it's simply another achievement to accomplish. Dieting is like any other skill-you have to buckle down and work at it. As long as you act in a smart, reasonable way, you'll ultimately get where you want to be. Here are some weight loss pearls for you.   1. It's Not a Diet. It's a Lifestyle Thinking of a diet as something you're on and suffering through only for the short term doesn't work. To shed weight and keep it off, you need to make permanent changes to the way you eat. It's OK to indulge occasionally, of course, but if you cut calories temporarily and then revert to your old way of eating, you'll gain back the weight quicker than you can say yo-yo. Use it to lose it. Research shows that one of the best predictors of long-term weight loss is how many pounds you drop in the first month. For that reason, nutritionists often suggest being stricter for the first two weeks of your new eating strategy to build momentum. Cut out added sugar and alcohol and avoid unrefined carbs. After that, figure out how you can reincorporate them in a way that's healthy and maintainable.  2. There's a Right Way to Exercise Working out burns calories and fat and boosts your metabolism by building muscle. But those trying to lose weight are notorious for overestimating the number of calories they burn and underestimating the amount they take in. Unfortunately, your system is biologically programmed to hold on to extra pounds and that means when you start exercising, your body senses the deficit and ramps up its hunger signals. If you're not diligent, you'll eat everything you burn and then some. Use it, to lose it. Cardio gets all the exercise glory, but strength and interval training are the real heroes. They help you build lean muscle, which in turn increases your metabolism and calorie-burning ability 3. Don't Overreact to Mild Hunger Some people have a hard time losing  weight because of hunger anxiety. To them, being hungry is bad-something to be avoided at  all costs-so they carry snacks with them and eat when they don't need to. Others eat because they're stressed out or bored. While you never want to get to the point of being ravenous (that's when bingeing is likely to happen), a hunger pang, a craving, or the fact that it's 3:00 p.m. should not send you racing for the vending machine or obsessing about the energy bar in your purse. Ideally, you should put off eating until your stomach is growling and it's difficult to concentrate.  Use it to lose it. When you feel the urge to eat, use the HALT method. Ask yourself, Am I really hungry? Or am I angry or anxious, lonely or bored, or tired? If you're still not certain, try the apple test. If you're truly hungry, an apple should seem delicious; if it doesn't, something else is going on. Or you can try drinking water and making yourself busy, if you are still hungry try a healthy snack.  4. Not All Calories Are Created Equal The mechanics of weight loss are pretty simple: Take in fewer calories than you use for energy. But the kind of food you eat makes all the difference. Processed food that's high in saturated fat and refined starch or sugar can cause inflammation that disrupts the hormone signals that tell your brain you're full. The result: You eat a lot more.  Use it to lose it. Clean up your diet. Swap in whole, unprocessed foods, including vegetables, lean protein, and healthy fats that will fill you up and give you the biggest nutritional bang for your calorie buck. In a few weeks, as your brain starts receiving regular hunger and fullness signals once again, you'll notice that you feel less hungry overall and naturally start cutting back on the amount you eat.  5. Protein, Produce, and Plant-Based Fats Are Your Weight-Loss Trinity Here's why eating the three Ps regularly will help you drop pounds. Protein fills you  up. You need it to build lean muscle, which keeps your metabolism humming so that you can torch more fat. People in a weight-loss program who ate double the recommended daily allowance for protein (about 110 grams for a 150-pound woman) lost 70 percent of their weight from fat, while people who ate the RDA lost only about 40 percent, one study found. Produce is packed with filling fiber. "It's very difficult to consume too many calories if you're eating a lot of vegetables. Example: Three cups of broccoli is a lot of food, yet only 93 calories. (Fruit is another story. It can be easy to overeat and can contain a lot of calories from sugar, so be sure to monitor your intake.) Plant-based fats like olive oil and those in avocados and nuts are healthy and extra satiating.  Use it to lose it. Aim to incorporate each of the three Ps into every meal and snack. People who eat protein throughout the day are able to keep weight off, according to a study in the American Journal of Clinical Nutrition. In addition to meat, poultry and seafood, good sources are beans, lentils, eggs, tofu, and yogurt. As for fat, keep portion sizes in check by measuring out salad dressing, oil, and nut butters (shoot for one to two tablespoons). Finally, eat veggies or a little fruit at every meal. People who did that consumed 308 fewer calories but didn't feel any hungrier than when they didn't eat more produce.  7. How You Eat Is As Important As What You Eat In order  for your brain to register that you're full, you need to focus on what you're eating. Sit down whenever you eat, preferably at a table. Turn off the TV or computer, put down your phone, and look at your food. Smell it. Chew slowly, and don't put another bite on your fork until you swallow. When women ate lunch this attentively, they consumed 30 percent less when snacking later than those who listened to an audiobook at lunchtime, according to a study in the [place] Journal of  Nutrition. 8. Weighing Yourself Really Works The scale provides the best evidence about whether your efforts are paying off. Seeing the numbers tick up or down or stagnate is motivation to keep going-or to rethink your approach. A 2015 study at [facility] found that daily weigh-ins helped people lose more weight, keep it off, and maintain that loss, even after two years. Use it to lose it. Step on the scale at the same time every day for the best results. If your weight shoots up several pounds from one weigh-in to the next, don't freak out. Eating a lot of salt the night before or having your period is the likely culprit. The number should return to normal in a day or two. It's a steady climb that you need to do something about. 9. Too Much Stress and Too Little Sleep Are Your Enemies When you're tired and frazzled, your body cranks up the production of cortisol, the stress hormone that can cause carb cravings. Not getting enough sleep also boosts your levels of ghrelin, a hormone associated with hunger, while suppressing leptin, a hormone that signals fullness and satiety. People on a diet who slept only five and a half hours a night for two weeks lost 55 percent less fat and were hungrier than those who slept eight and a half hours, according to a study in the [place] Medical Association Journal. Use it to lose it. Prioritize sleep, aiming for seven hours or more a night, which research shows helps lower stress. And make sure you're getting quality zzz's. If a snoring spouse or a fidgety cat wakes you up frequently throughout the night, you may end up getting the equivalent of just four hours of sleep, according to a study from [facility]. Keep pets out of the bedroom, and use a white-noise app to drown out snoring. 10. You Will Hit a plateau-And You Can Bust Through It As you slim down, your body releases much less leptin, the fullness hormone.  If you're not strength training, start  right now. Building muscle can raise your metabolism to help you overcome a plateau. To keep your body challenged and burning calories, incorporate new moves and more intense intervals into your workouts or add another sweat session to your weekly routine. Alternatively, cut an extra 100 calories or so a day from your diet. Now that you've lost weight, your body simply doesn't need as much fuel.    Since food equals calories, in order to lose weight you must either eat fewer calories, exercise more to burn off calories with activity, or both. Food that is not used to fuel the body is stored as fat. A major component of losing weight is to make smarter food choices. Here's how:  1)   Limit non-nutritious foods, such as: Sugar, honey, syrups and candy Pastries, donuts, pies, cakes and cookies Soft drinks, sweetened juices and alcoholic beverages  2)  Cut down on high-fat foods by: - Choosing poultry, fish or lean  red meat - Choosing low-fat cooking methods, such as baking, broiling, steaming, grilling and boiling - Using low-fat or non-fat dairy products - Using vinaigrette, herbs, lemon or fat-free salad dressings - Avoiding fatty meats, such as bacon, sausage, franks, ribs and luncheon meats - Avoiding high-fat snacks like nuts, chips and chocolate - Avoiding fried foods - Using less butter, margarine, oil and mayonnaise - Avoiding high-fat gravies, cream sauces and cream-based soups  3) Eat a variety of foods, including: - Fruit and vegetables that are raw, steamed or baked - Whole grains, breads, cereal, rice and pasta - Dairy products, such as low-fat or non-fat milk or yogurt, low-fat cottage cheese and low-fat cheese - Protein-rich foods like chicken, Mal[place]urkey, fish, lean meat and legumes, or beans  4) Change your eating habits by: - Eat three balanced meals a day to help control your hunger - Watch portion sizes and eat small servings of a variety of foods - Choose low-calorie  snacks - Eat only when you are hungry and stop when you are satisfied - Eat slowly and try not to perform other tasks while eating - Find other activities to distract you from food, such as walking, taking up a hobby or being involved in the community - Include regular exercise in your daily routine ( minimum of 20 min of moderate-intensity exercise at least 5 days/week)  - Find a support group, if necessary, for emotional support in your weight loss journey           Easy ways to cut 100 calories   1. Eat your eggs with hot sauce OR salsa instead of cheese.  Eggs are great for breakfast, but many people consider eggs and cheese to be BFFs. Instead of cheese-1 oz. of cheddar has 114 calories-top your eggs with hot sauce, which contains no calories and helps with satiety and metabolism. Salsa is also a great option!!  2. Top your toast, waffles or pancakes with fresh berries instead of jelly or syrup. Half a cup of berries-fresh, frozen or thawed-has about 40 calories, compared with 2 tbsp. of maple syrup or jelly, which both have about 100 calories. The berries will also give you a good punch of fiber, which helps keep you full and satisfied and won't spike blood sugar quickly like the jelly or syrup. 3. Swap the non-fat latte for black coffee with a splash of half-and-half. Contrary to its name, that non-fat latte has 130 calories and a startling 19g of carbohydrates per 16 oz. serving. Replacing that 'light' drinkable dessert with a black coffee with a splash of half-and-half saves you more than 100 calories per 16 oz. serving. 4. Sprinkle salads with freeze-dried raspberries instead of dried cranberries. If you want a sweet addition to your nutritious salad, stay away from dried cranberries. They have a whopping 130 calories per  cup and 30g carbohydrates. Instead, sprinkle freeze-dried raspberries guilt-free and save more than 100 calories per  cup serving, adding 3g of belly-filling  fiber. 5. Go for mustard in place of mayo on your sandwich. Mustard can add really nice flavor to any sandwich, and there are tons of varieties, from spicy to honey. A serving of mayo is 95 calories, versus 10 calories in a serving of mustard.  Or try an avocado mayo spread: You can find the recipe few click this link: https://www.californiaavocado.com/recipes/recipe-container/california-avocado-mayo 6. Choose a DIY salad dressing instead of the store-bought kind. Mix Dijon or whole grain mustard with low-fat Kefir or red wine vinegar and garlic.  7. Use hummus as a spread instead of a dip. Use hummus as a spread on a high-fiber cracker or tortilla with a sandwich and save on calories without sacrificing taste. 8. Pick just one salad "accessory." Salad isn't automatically a calorie winner. It's easy to over-accessorize with toppings. Instead of topping your salad with nuts, avocado and cranberries (all three will clock in at 313 calories), just pick one. The next day, choose a different accessory, which will also keep your salad interesting. You don't wear all your jewelry every day, right? 9. Ditch the white pasta in favor of spaghetti squash. One cup of cooked spaghetti squash has about 40 calories, compared with traditional spaghetti, which comes with more than 200. Spaghetti squash is also nutrient-dense. It's a good source of fiber and Vitamins A and C, and it can be eaten just like you would eat pasta-with a great tomato sauce and [place] meatballs or with pesto, tofu and spinach, for example. 10. Dress up your chili, soups and stews with non-fat [place] yogurt instead of sour cream. Just a 'dollop' of sour cream can set you back 115 calories and a whopping 12g of fat-seven of which are of the artery-clogging variety. Added bonus: [place] yogurt is packed with muscle-building protein, calcium and B Vitamins. 11. Mash cauliflower instead of mashed potatoes. One cup of traditional mashed potatoes-in  all their creamy goodness-has more than 200 calories, compared to mashed cauliflower, which you can typically eat for less than 100 calories per 1 cup serving. Cauliflower is a great source of the antioxidant indole-3-carbinol (I3C), which may help reduce the risk of some cancers, like breast cancer. 12. Ditch the ice cream sundae in favor of a [place] yogurt parfait. Instead of a cup of ice cream or fro-yo for dessert, try 1 cup of nonfat Greek yogurt topped with fresh berries and a sprinkle of cacao nibs. Both toppings are packed with antioxidants, which can help reduce cellular inflammation and oxidative damage. And the comparison is a no-brainer: One cup of ice cream has about 275 calories; one cup of frozen yogurt has about 230; and a cup of Greek yogurt has just 130, plus twice the protein, so you're less likely to return to the freezer for a second helping. 13. Put olive oil in a spray container instead of using it directly from the bottle. Each tablespoon of olive oil is 120 calories and 15g of fat. Use a mister instead of pouring it straight into the pan or onto a salad. This allows for portion control and will save you more than 100 calories. 14. When baking, substitute canned pumpkin for butter or oil. Canned pumpkin-not pumpkin pie mix-is loaded with Vitamin A, which is important for skin and eye health, as well as immunity. And the comparisons are pretty crazy:  cup of canned pumpkin has about 40 calories, compared to butter or oil, which has more than 800 calories. Yes, 800 calories. Applesauce and mashed banana can also serve as good substitutions for butter or oil, usually in a 1:1 ratio. 15. Top casseroles with high-fiber cereal instead of breadcrumbs. Breadcrumbs are typically made with white bread, while breakfast cereals contain 5-9g of fiber per serving. Not only will you save more than 150 calories per  cup serving, the swap will also keep you more full and you'll get a metabolism boost  from the added fiber. 16. Snack on pistachios instead of macadamia nuts. Believe it or not, you get the same amount of calories from 35 pistachios (  100 calories) as you would from only five macadamia nuts. 17. Chow down on kale chips rather than potato chips. This is my favorite 'don't knock it 'till you try it' swap. Kale chips are so easy to make at home, and you can spice them up with a little grated parmesan or chili powder. Plus, they're a mere fraction of the calories of potato chips, but with the same crunch factor we crave so often. 18. Add seltzer and some fruit slices to your cocktail instead of soda or fruit juice. One cup of soda or fruit juice can pack on as much as 140 calories. Instead, use seltzer and fruit slices. The fruit provides valuable phytochemicals, such as flavonoids and anthocyanins, which help to combat cancer and stave off the aging process.

## 2017-03-21 LAB — COMPREHENSIVE METABOLIC PANEL
ALBUMIN: 4.3 g/dL (ref 3.5–5.5)
ALK PHOS: 91 IU/L (ref 39–117)
ALT: 38 IU/L — ABNORMAL HIGH (ref 0–32)
AST: 35 IU/L (ref 0–40)
Albumin/Globulin Ratio: 1.5 (ref 1.2–2.2)
BILIRUBIN TOTAL: 0.5 mg/dL (ref 0.0–1.2)
BUN / CREAT RATIO: 16 (ref 9–23)
BUN: 11 mg/dL (ref 6–20)
CHLORIDE: 96 mmol/L (ref 96–106)
CO2: 26 mmol/L (ref 20–29)
Calcium: 9.8 mg/dL (ref 8.7–10.2)
Creatinine, Ser: 0.69 mg/dL (ref 0.57–1.00)
GFR calc Af Amer: 129 mL/min/{1.73_m2} (ref >59)
GFR calc non Af Amer: 112 mL/min/{1.73_m2} (ref >59)
GLOBULIN, TOTAL: 2.8 g/dL (ref 1.5–4.5)
GLUCOSE: 107 mg/dL — AB (ref 65–99)
Potassium: 4.3 mmol/L (ref 3.5–5.2)
SODIUM: 139 mmol/L (ref 134–144)
Total Protein: 7.1 g/dL (ref 6.0–8.5)

## 2017-03-21 LAB — CBC WITH DIFFERENTIAL/PLATELET
BASOS ABS: 0 10*3/uL (ref 0.0–0.2)
Basos: 0 %
EOS (ABSOLUTE): 0.1 10*3/uL (ref 0.0–0.4)
Eos: 1 %
HEMATOCRIT: 46.8 % — AB (ref 34.0–46.6)
HEMOGLOBIN: 15.5 g/dL (ref 11.1–15.9)
Immature Grans (Abs): 0 10*3/uL (ref 0.0–0.1)
Immature Granulocytes: 0 %
LYMPHS ABS: 2.7 10*3/uL (ref 0.7–3.1)
Lymphs: 33 %
MCH: 30.3 pg (ref 26.6–33.0)
MCHC: 33.1 g/dL (ref 31.5–35.7)
MCV: 91 fL (ref 79–97)
MONOCYTES: 6 %
MONOS ABS: 0.5 10*3/uL (ref 0.1–0.9)
Neutrophils Absolute: 5 10*3/uL (ref 1.4–7.0)
Neutrophils: 60 %
Platelets: 293 10*3/uL (ref 150–379)
RBC: 5.12 x10E6/uL (ref 3.77–5.28)
RDW: 13.8 % (ref 12.3–15.4)
WBC: 8.3 10*3/uL (ref 3.4–10.8)

## 2017-03-21 LAB — HEMOGLOBIN A1C
ESTIMATED AVERAGE GLUCOSE: 126 mg/dL
HEMOGLOBIN A1C: 6 % — AB (ref 4.8–5.6)

## 2017-03-21 LAB — LIPID PANEL
CHOL/HDL RATIO: 4.7 ratio — AB (ref 0.0–4.4)
Cholesterol, Total: 238 mg/dL — ABNORMAL HIGH (ref 100–199)
HDL: 51 mg/dL (ref >39)
LDL Calculated: 160 mg/dL — ABNORMAL HIGH (ref 0–99)
Triglycerides: 137 mg/dL (ref 0–149)
VLDL Cholesterol Cal: 27 mg/dL (ref 5–40)

## 2017-03-21 LAB — PHOSPHORUS: PHOSPHORUS: 3.4 mg/dL (ref 2.5–4.5)

## 2017-03-21 LAB — T4, FREE: FREE T4: 1.57 ng/dL (ref 0.82–1.77)

## 2017-03-21 LAB — MAGNESIUM: MAGNESIUM: 2 mg/dL (ref 1.6–2.3)

## 2017-03-21 LAB — VITAMIN D 25 HYDROXY (VIT D DEFICIENCY, FRACTURES): VIT D 25 HYDROXY: 9.8 ng/mL — AB (ref 30.0–100.0)

## 2017-03-21 LAB — TSH: TSH: 3.56 u[IU]/mL (ref 0.450–4.500)

## 2017-04-03 ENCOUNTER — Ambulatory Visit (INDEPENDENT_AMBULATORY_CARE_PROVIDER_SITE_OTHER): Payer: No Typology Code available for payment source | Admitting: Family Medicine

## 2017-04-03 ENCOUNTER — Encounter: Payer: Self-pay | Admitting: Family Medicine

## 2017-04-03 VITALS — BP 170/96 | HR 64 | Ht 62.25 in | Wt 248.4 lb

## 2017-04-03 DIAGNOSIS — I10 Essential (primary) hypertension: Secondary | ICD-10-CM

## 2017-04-03 DIAGNOSIS — E785 Hyperlipidemia, unspecified: Secondary | ICD-10-CM

## 2017-04-03 DIAGNOSIS — R7302 Impaired glucose tolerance (oral): Secondary | ICD-10-CM

## 2017-04-03 DIAGNOSIS — R748 Abnormal levels of other serum enzymes: Secondary | ICD-10-CM

## 2017-04-03 DIAGNOSIS — R609 Edema, unspecified: Secondary | ICD-10-CM

## 2017-04-03 DIAGNOSIS — E559 Vitamin D deficiency, unspecified: Secondary | ICD-10-CM

## 2017-04-03 MED ORDER — VITAMIN D3 125 MCG (5000 UT) PO TABS: ORAL_TABLET | ORAL | 3 refills | Status: DC

## 2017-04-03 MED ORDER — LOSARTAN POTASSIUM 100 MG PO TABS: 50.0000 mg | ORAL_TABLET | Freq: Every day | ORAL | 0 refills | Status: DC

## 2017-04-03 MED ORDER — VITAMIN D (ERGOCALCIFEROL) 1.25 MG (50000 UNIT) PO CAPS: 50000.0000 [IU] | ORAL_CAPSULE | ORAL | 10 refills | Status: DC

## 2017-04-03 NOTE — Patient Instructions (Addendum)
Please check your blood pressure at home at random times after your sitting for 15-20 minutes and you haven't had any caffeine or anything like that prior.  Your goal should be less than 130/80 on a regular basis.  If you're not at goal after 2 weeks take a whole pill of losartan   If you have insomnia or difficulty sleeping, this information is for you:  - Avoid caffeinated beverages after lunch,  no alcoholic beverages,  no eating within 2-3 hours of lying down,  avoid exposure to blue light before bed,  avoid daytime naps, and  needs to maintain a regular sleep schedule- go to sleep and wake up around the same time every night.  Blue light blocking glasses.  - Resolve concerns or worries before entering bedroom:  Discussed relaxation techniques with patient and to keep a journal to write down fears\ worries.   - Recommend patient meditate or do deep breathing exercises to help relax.   Incorporate the use of white noise machines or listen to "sleep meditation music", or recordings of guided meditations for sleep from YouTube which are free, such as  "guided meditation for detachment from over thinking"  by Ina KickMichael Sealey.

## 2017-04-03 NOTE — Progress Notes (Signed)
Assessment and plan:  1. Vitamin D insufficiency   2. HTN, goal below 130/80   3. Obesity, Class III, BMI 40-49.9 (morbid obesity) (HCC)   4. Peripheral edema   5. Elevated liver enzymes   6. Glucose intolerance (impaired glucose tolerance)   7. Vitamin D deficiency   8. Hyperlipidemia, unspecified hyperlipidemia type     No problem-specific Assessment & Plan notes found for this encounter.     Meds ordered this encounter  Medications  . Vitamin D, Ergocalciferol, (DRISDOL) 50000 units CAPS capsule    Sig: Take 1 capsule (50,000 Units total) by mouth every 7 (seven) days.    Dispense:  12 capsule    Refill:  10  . Cholecalciferol (VITAMIN D3) 5000 units TABS    Sig: 5,000 IU OTC vitamin D3 daily.    Dispense:  90 tablet    Refill:  3  . losartan (COZAAR) 100 MG tablet    Sig: Take 0.5 tablets (50 mg total) by mouth daily.    Dispense:  90 tablet    Refill:  0    Discontinued Medications   No medications on file    Modified Medications   No medications on file     No orders of the defined types were placed in this encounter.    Return in about 4 weeks (around 05/01/2017) for Hypertension follow up 4 wks after starting new med- losartan.  Anticipatory guidance and routine counseling done re: condition, txmnt options and need for follow up. All questions of patient's were answered.   Gross side effects, risk and benefits, and alternatives of medications discussed with patient.  Patient is aware that all medications have potential side effects and we are unable to predict every sideeffect or drug-drug interaction that may occur.  Expresses verbal understanding and consents to current therapy plan and treatment regiment.  Please see AVS handed out to patient at the end of our visit for additional patient instructions/ counseling done pertaining to today's office visit.  Note: This document was  prepared using Dragon voice recognition software and may include unintentional dictation errors.   ----------------------------------------------------------------------------------------------------------------------  Subjective:   CC:   Vickie [name] is a 36 y.o. female who [facility] for review and discussion of recent bloodwork that was done.  1. All recent blood work that we ordered was reviewed with patient today.  Patient was counseled on all abnormalities and we discussed dietary and lifestyle changes that could help those values (also medications when appropriate).  Extensive health counseling performed and all patient's concerns/ questions were addressed.  2. Patient with a blood pressure which has been elevated on her home meter as well.  She has been having occasional dizzy spells and some lightheadedness headaches also does get blurred vision from time to time a lot as well.  Has never been on any other medicine besides the hydrochlorothiazide.  Has been on that medicine alone for approximately 6 months now. 3. --->  Hasn't checked bp since she left here last OV.   Patient started weight watchers roughly a week and a half ago.  She has been doing the program diligently.  She is diligently documenting everything she puts in her mouth on the Weight Watchers app.  - bought a gallon jog for water.   Doing a gallon per day.      Wt Readings from Last 3 Encounters:  04/03/17 248 lb 6.4  oz (112.7 kg)  03/20/17 253 lb 1.6 oz (114.8 kg)  03/22/12 216 lb (98 kg)   BP Readings from Last 3 Encounters:  04/03/17 (!) 170/96  03/20/17 129/86  03/22/12 120/82   Pulse Readings from Last 3 Encounters:  04/03/17 64  03/20/17 84   BMI Readings from Last 3 Encounters:  04/03/17 45.07 kg/m  03/20/17 45.92 kg/m  03/22/12 39.51 kg/m     Patient Care Team    Relationship Specialty Notifications Start End  [name], Zhi Geier, DO  PCP - General Family Medicine  03/20/17     Full medical history updated and reviewed in the office today  Patient Active Problem List   Diagnosis Date Noted  . Glucose intolerance (impaired glucose tolerance) 04/03/2017  . Vitamin D deficiency 04/03/2017  . HLD (hyperlipidemia) 04/03/2017  . HTN, goal below 130/80 03/20/2017  . History of chronic back pain- MRI done in past 03/20/2017  . Peripheral edema 03/20/2017  . Snoring- terrible 03/20/2017  . Obesity, Class III, BMI 40-49.9 (morbid obesity) (HCC) 03/20/2017  . Elevated liver enzymes 03/20/2017  . Myalgia 03/20/2017  . Arthralgia 03/20/2017  . BV (bacterial vaginosis) 03/22/2012  . Urinary catheter infection (HCC) 03/22/2012    Past Medical History:  Diagnosis Date  . Abnormal Pap smear   . Anxiety   . Broken arm    Left   . Complication of anesthesia    Epidural- baby's HR decreased ; developed  fever  . Fatigue 2008  . H/O dyspareunia 01/23/11  . H/O varicella   . Hx of candidiasis   . Hx: UTI (urinary tract infection)   . Hypertension   . Increased BMI   . Irregular periods/menstrual cycles 2008  . Postpartum depression   . Vaginal lesion 2009    Painful  . Vaginal pain 01/23/11    Past Surgical History:  Procedure Laterality Date  . repair broken arm    . TONSILLECTOMY     Age 14  . TUBAL LIGATION    . WISDOM TOOTH EXTRACTION      Social History  Substance Use Topics  . Smoking status: Never Smoker  . Smokeless tobacco: Never Used  . Alcohol use 0.5 oz/week    1 Standard drinks or equivalent per week     Comment: social    Family Hx: Family History  Problem Relation Age of Onset  . Hypertension Mother   . Diabetes Mother   . Cancer Mother        skin  . Leukemia Mother   . Diabetes Father   . Hypertension Father   . COPD Father   . Stroke Maternal Grandmother      Medications: Current Outpatient Prescriptions  Medication Sig Dispense Refill  . aspirin (BAYER LOW DOSE) 81 MG EC  tablet Take 81 mg by mouth daily. Swallow whole.    . hydrochlorothiazide (HYDRODIURIL) 12.5 MG tablet     . pantoprazole (PROTONIX) 40 MG tablet     . Cholecalciferol (VITAMIN D3) 5000 units TABS 5,000 IU OTC vitamin D3 daily. 90 tablet 3  . losartan (COZAAR) 100 MG tablet Take 0.5 tablets (50 mg total) by mouth daily. 90 tablet 0  . Vitamin D, Ergocalciferol, (DRISDOL) 50000 units CAPS capsule Take 1 capsule (50,000 Units total) by mouth every 7 (seven) days. 12 capsule 10   No current facility-administered medications for this visit.     Allergies:  No Known Allergies   Review of Systems: General:   No F/C, wt loss  Pulm:   No DIB, SOB, pleuritic chest pain Card:  No CP, palpitations Abd:  No n/v/d or pain Ext:  No inc edema from baseline  Objective:  Blood pressure (!) 170/96, pulse 64, height 5' 2.25" (1.581 m), weight 248 lb 6.4 oz (112.7 kg), last menstrual period 03/13/2017. Body mass index is 45.07 kg/m. Gen:   Well NAD, A and O *3 HEENT:    Alger/AT, EOMI,  MMM Lungs:   Normal work of breathing. CTA B/L, no Wh, rhonchi Heart:   RRR, S1, S2 WNL's, no MRG Abd:   No gross distention Exts:    warm, pink,  Brisk capillary refill, warm and well perfused.  Psych:    No HI/SI, judgement and insight good, Euthymic mood. Full Affect.   Recent Results (from the past 2160 hour(s))  CBC with Differential/Platelet     Status: Abnormal   Collection Time: 03/20/17  9:49 AM  Result Value Ref Range   WBC 8.3 3.4 - 10.8 x10E3/uL   RBC 5.12 3.77 - 5.28 x10E6/uL   Hemoglobin 15.5 11.1 - 15.9 g/dL   Hematocrit 60.4 (H) 54.0 - 46.6 %   MCV 91 79 - 97 fL   MCH 30.3 26.6 - 33.0 pg   MCHC 33.1 31.5 - 35.7 g/dL   RDW 98.1 19.1 - 47.8 %   Platelets 293 150 - 379 x10E3/uL   Neutrophils 60 Not Estab. %   Lymphs 33 Not Estab. %   Monocytes 6 Not Estab. %   Eos 1 Not Estab. %   Basos 0 Not Estab. %   Neutrophils Absolute 5.0 1.4 - 7.0 x10E3/uL   Lymphocytes Absolute 2.7 0.7 - 3.1 x10E3/uL     Monocytes Absolute 0.5 0.1 - 0.9 x10E3/uL   EOS (ABSOLUTE) 0.1 0.0 - 0.4 x10E3/uL   Basophils Absolute 0.0 0.0 - 0.2 x10E3/uL   Immature Granulocytes 0 Not Estab. %   Immature Grans (Abs) 0.0 0.0 - 0.1 x10E3/uL  Comprehensive metabolic panel     Status: Abnormal   Collection Time: 03/20/17  9:49 AM  Result Value Ref Range   Glucose 107 (H) 65 - 99 mg/dL   BUN 11 6 - 20 mg/dL   Creatinine, Ser 2.95 0.57 - 1.00 mg/dL   GFR calc non Af Amer 112 >59 mL/min/1.73   GFR calc Af Amer 129 >59 mL/min/1.73   BUN/Creatinine Ratio 16 9 - 23   Sodium 139 134 - 144 mmol/L   Potassium 4.3 3.5 - 5.2 mmol/L   Chloride 96 96 - 106 mmol/L   CO2 26 20 - 29 mmol/L   Calcium 9.8 8.7 - 10.2 mg/dL   Total Protein 7.1 6.0 - 8.5 g/dL   Albumin 4.3 3.5 - 5.5 g/dL   Globulin, Total 2.8 1.5 - 4.5 g/dL   Albumin/Globulin Ratio 1.5 1.2 - 2.2   Bilirubin Total 0.5 0.0 - 1.2 mg/dL   Alkaline Phosphatase 91 39 - 117 IU/L   AST 35 0 - 40 IU/L   ALT 38 (H) 0 - 32 IU/L  Hemoglobin A1c     Status: Abnormal   Collection Time: 03/20/17  9:49 AM  Result Value Ref Range   Hgb A1c MFr Bld 6.0 (H) 4.8 - 5.6 %    Comment:          Pre-diabetes: 5.7 - 6.4          Diabetes: >6.4          Glycemic control for adults with diabetes: <7.0  Est. average glucose Bld gHb Est-mCnc 126 mg/dL  Lipid panel     Status: Abnormal   Collection Time: 03/20/17  9:49 AM  Result Value Ref Range   Cholesterol, Total 238 (H) 100 - 199 mg/dL   Triglycerides 161 0 - 149 mg/dL   HDL 51 >09 mg/dL   VLDL Cholesterol Cal 27 5 - 40 mg/dL   LDL Calculated 604 (H) 0 - 99 mg/dL   Chol/HDL Ratio 4.7 (H) 0.0 - 4.4 ratio    Comment:                                   T. Chol/HDL Ratio                                             Men  Women                               1/2 Avg.Risk  3.4    3.3                                   Avg.Risk  5.0    4.4                                2X Avg.Risk  9.6    7.1                                3X Avg.Risk  23.4   11.0   TSH     Status: None   Collection Time: 03/20/17  9:49 AM  Result Value Ref Range   TSH 3.560 0.450 - 4.500 uIU/mL  T4, free     Status: None   Collection Time: 03/20/17  9:49 AM  Result Value Ref Range   Free T4 1.57 0.82 - 1.77 ng/dL  VITAMIN D 25 Hydroxy (Vit-D Deficiency, Fractures)     Status: Abnormal   Collection Time: 03/20/17  9:49 AM  Result Value Ref Range   Vit D, 25-Hydroxy 9.8 (L) 30.0 - 100.0 ng/mL    Comment: Vitamin D deficiency has been defined by the Institute of Medicine and an Endocrine Society practice guideline as a level of serum 25-OH vitamin D less than 20 ng/mL (1,2). The Endocrine Society went on to further define vitamin D insufficiency as a level between 21 and 29 ng/mL (2). 1. IOM (Institute of Medicine). 2010. Dietary reference    intakes for calcium and D. [place]: The    [employer]. 2. Holick MF, Binkley [place], Bischoff-Ferrari HA, et al.    Evaluation, treatment, and prevention of vitamin D    deficiency: an Endocrine Society clinical practice    guideline. JCEM. 2011 Jul; 96(7):1911-30.   Magnesium     Status: None   Collection Time: 03/20/17  9:49 AM  Result Value Ref Range   Magnesium 2.0 1.6 - 2.3 mg/dL  Phosphorus     Status: None   Collection Time: 03/20/17  9:49 AM  Result Value Ref Range   Phosphorus 3.4 2.5 - 4.5  mg/dL

## 2017-04-09 ENCOUNTER — Encounter: Payer: Self-pay | Admitting: Family Medicine

## 2017-04-09 ENCOUNTER — Other Ambulatory Visit: Payer: Self-pay

## 2017-04-09 DIAGNOSIS — I10 Essential (primary) hypertension: Secondary | ICD-10-CM

## 2017-04-09 MED ORDER — HYDROCHLOROTHIAZIDE 12.5 MG PO TABS: 12.5000 mg | ORAL_TABLET | Freq: Every day | ORAL | 1 refills | Status: DC

## 2017-04-09 NOTE — Telephone Encounter (Signed)
[place] [facility] and would like to know if we can refill HCTZ 12.5mg  - we have not filled for her and she states that the doctor that was filling this medication will not give her anymore refills.  MPulliam, CMA/RT(R)

## 2017-04-09 NOTE — Telephone Encounter (Signed)
Yes med refill is fine - #90, 1 refill..  Remind patient I need to see her the end of this month and bring in a blood pressure log since we started her on the new medicine last office visit about one week ago.  We will also may need to get labs on her when she comes.  This is very important for her to keep this appointment.

## 2017-04-09 NOTE — Telephone Encounter (Signed)
Medication sent in - sent patient message to notify. MPulliam, CMA/RT(R)

## 2017-04-10 ENCOUNTER — Ambulatory Visit (INDEPENDENT_AMBULATORY_CARE_PROVIDER_SITE_OTHER): Payer: No Typology Code available for payment source | Admitting: Family Medicine

## 2017-04-10 ENCOUNTER — Encounter: Payer: Self-pay | Admitting: Family Medicine

## 2017-04-10 VITALS — BP 134/90 | HR 63 | Ht 62.25 in | Wt 247.9 lb

## 2017-04-10 DIAGNOSIS — K1121 Acute sialoadenitis: Secondary | ICD-10-CM

## 2017-04-10 NOTE — Patient Instructions (Signed)
Parotid massage, hydration, good oral hygiene, warm compresses or warm washcloths, lemon drops or other hard candies that make you produce a lot of saliva.  If you develop fever chills or the swelling gets worse, pain gets worse or redness here face gets worse, please let me know and we will call in Augmentin antibiotics for you.

## 2017-04-12 ENCOUNTER — Ambulatory Visit: Payer: No Typology Code available for payment source | Admitting: Family Medicine

## 2017-04-13 ENCOUNTER — Ambulatory Visit: Payer: No Typology Code available for payment source | Admitting: Family Medicine

## 2017-04-13 NOTE — Progress Notes (Signed)
Pt here for an acute care OV today   Impression and Recommendations:    1. Acute suppurative parotitis      No problem-specific Assessment & Plan notes found for this encounter.   The patient was counseled, risk factors were discussed, anticipatory guidance given.  New Prescriptions   No medications on file    Discontinued Medications   No medications on file    Modified Medications   No medications on file    No orders of the defined types were placed in this encounter.    Gross side effects, risk and benefits, and alternatives of medications and treatment plan in general discussed with patient.  Patient is aware that all medications have potential side effects and we are unable to predict every side effect or drug-drug interaction that may occur.   Patient will call with any questions prior to using medication if they have concerns.  Expresses verbal understanding and consents to current therapy and treatment regimen.  No barriers to understanding were identified.  Red flag symptoms and signs discussed in detail.  Patient expressed understanding regarding what to do in case of emergency\urgent symptoms  Please see AVS handed out to patient at the end of our visit for further patient instructions/ counseling done pertaining to today's office visit.   Return for 2 days- reck parotid gland..     Note: This document was prepared using Dragon voice recognition software and may include unintentional dictation errors.  Thomasene Loteborah Caldwell Kronenberger 12:15 PM --------------------------------------------------------------------------------------------------------------------------------------------------------------------------------------------------------------------------------------------    Subjective:    CC:  Chief Complaint  Patient presents with  . Facial Pain    pain right side of the lower face x 4 days - shooting pain with touch     HPI: Vickie RainwaterKristy L [name]  is a 36 y.o. female who [facility] today for issues as discussed below.   No problems updated.   Wt Readings from Last 3 Encounters:  04/10/17 247 lb 14.4 oz (112.4 kg)  04/03/17 248 lb 6.4 oz (112.7 kg)  03/20/17 253 lb 1.6 oz (114.8 kg)   BP Readings from Last 3 Encounters:  04/10/17 134/90  04/03/17 (!) 170/96  03/20/17 129/86   BMI Readings from Last 3 Encounters:  04/10/17 44.98 kg/m  04/03/17 45.07 kg/m  03/20/17 45.92 kg/m     Patient Care Team    Relationship Specialty Notifications Start End  [name], [name], DO PCP - General Family Medicine  03/20/17      Patient Active Problem List   Diagnosis Date Noted  . Acute suppurative parotitis 04/10/2017  . Glucose intolerance (impaired glucose tolerance) 04/03/2017  . Vitamin D deficiency 04/03/2017  . HLD (hyperlipidemia) 04/03/2017  . HTN, goal below 130/80 03/20/2017  . History of chronic back pain- MRI done in past 03/20/2017  . Peripheral edema 03/20/2017  . Snoring- terrible 03/20/2017  . Obesity, Class III, BMI 40-49.9 (morbid obesity) (HCC) 03/20/2017  . Elevated liver enzymes 03/20/2017  . Myalgia 03/20/2017  . Arthralgia 03/20/2017  . BV (bacterial vaginosis) 03/22/2012  . Urinary catheter infection (HCC) 03/22/2012    Past Medical history, Surgical history, Family history, Social history, Allergies and Medications have been entered into the medical record, reviewed and changed as needed.    No outpatient prescriptions have been marked as taking for the 04/10/17 encounter (Office Visit) with [name], Bianka Liberati, DO.    Allergies:  No Known Allergies   Review of Systems: General:   Denies fever, chills,  unexplained weight loss.  Optho/Auditory:   Denies visual changes, blurred vision/LOV Respiratory:   Denies wheeze, DOE more than baseline levels.  Cardiovascular:   Denies chest pain, palpitations, new onset peripheral edema  Gastrointestinal:   Denies  nausea, vomiting, diarrhea, abd pain.  Genitourinary: Denies dysuria, freq/ urgency, flank pain or discharge from genitals.  Endocrine:     Denies hot or cold intolerance, polyuria, polydipsia. Musculoskeletal:   Denies unexplained myalgias, joint swelling, unexplained arthralgias, gait problems.  Skin:  Denies new onset rash, suspicious lesions Neurological:     Denies dizziness, unexplained weakness, numbness  Psychiatric/Behavioral:   Denies mood changes, suicidal or homicidal ideations, hallucinations    Objective:   Blood pressure 134/90, pulse 63, height 5' 2.25" (1.581 m), weight 247 lb 14.4 oz (112.4 kg), last menstrual period 03/13/2017. Body mass index is 44.98 kg/m. General:  Well Developed, well nourished, appropriate for stated age.  Neuro:  Alert and oriented,  extra-ocular muscles intact  HEENT:  Normocephalic, atraumatic, neck supple Skin:  no gross rash, warm, pink. Cardiac:  RRR, S1 S2 Respiratory:  ECTA B/L and A/P, Not using accessory muscles, speaking in full sentences- unlabored. Vascular:  Ext warm, no cyanosis apprec.; cap RF less 2 sec. Psych:  No HI/SI, judgement and insight good, Euthymic mood. Full Affect.

## 2017-05-09 ENCOUNTER — Ambulatory Visit: Payer: No Typology Code available for payment source | Admitting: Family Medicine

## 2017-05-14 ENCOUNTER — Ambulatory Visit: Payer: No Typology Code available for payment source | Admitting: Family Medicine

## 2017-05-24 ENCOUNTER — Ambulatory Visit (INDEPENDENT_AMBULATORY_CARE_PROVIDER_SITE_OTHER): Payer: No Typology Code available for payment source | Admitting: Family Medicine

## 2017-05-24 VITALS — BP 122/82 | HR 78 | Ht 62.25 in | Wt 251.5 lb

## 2017-05-24 DIAGNOSIS — E559 Vitamin D deficiency, unspecified: Secondary | ICD-10-CM

## 2017-05-24 DIAGNOSIS — I10 Essential (primary) hypertension: Secondary | ICD-10-CM

## 2017-05-24 DIAGNOSIS — R7302 Impaired glucose tolerance (oral): Secondary | ICD-10-CM

## 2017-05-24 NOTE — Progress Notes (Signed)
Impression and Recommendations:    1. HTN, goal below 130/80   2. Vitamin D insufficiency   3. Obesity, Class III, BMI 40-49.9 (morbid obesity) (HCC)   4. Glucose intolerance (impaired glucose tolerance)      HTN, goal below 130/80  Vitamin D insufficiency  Obesity, Class III, BMI 40-49.9 (morbid obesity) (HCC)  Glucose intolerance (impaired glucose tolerance)  No problem-specific Assessment & Plan notes found for this encounter.   Education and routine counseling performed. Handouts provided.   New Prescriptions   No medications on file    Discontinued Medications   No medications on file    Modified Medications   No medications on file    No orders of the defined types were placed in this encounter.    Return for Nov 8th or so- wt mgt OV; consider BMP since started new meds.  The patient was counseled, risk factors were discussed, anticipatory guidance given.  Gross side effects, risk and benefits, and alternatives of medications discussed with patient.  Patient is aware that all medications have potential side effects and we are unable to predict every side effect or drug-drug interaction that may occur.  Expresses verbal understanding and consents to current therapy plan and treatment regimen.  Please see AVS handed out to patient at the end of our visit for further patient instructions/ counseling done pertaining to today's office visit.    Note:  This document was prepared using Dragon voice recognition software and may include unintentional dictation errors.     Subjective:    Chief Complaint  Patient presents with   Follow-up    hypertension    HPI: Vickie [name] is a 36 y.o. female who presents to [facility] today for follow up for HTN.     No problems updated.  Patient states that taking the    has helped her mood, motivation, energy levels etc.  She went.  Time without taking it as her  prescription lapsed and she noticed a huge difference as she felt more lethargic, less energetic, more depressed, fatigued.   HTN: Prior chronic F/upWe started patient on losartan.  On 7\31\18 because her blood pressure at that time was 170/96.   Since then taking meds- 50mg  daily and 12.5 HCTZ.  Less headaches but still present.  Blood pressure-she has not checked at home but today is much improved at 122/82.  - Patient reports good compliance with blood pressure medications  - Denies medication S-E   - Smoking Status noted   - She denies new onset of: chest pain, exercise intolerance, shortness of breath, dizziness, visual changes, headache, lower extremity swelling or claudication.   Today their BP is BP: 122/82   Last 3 blood pressure readings in our office are as follows: BP Readings from Last 3 Encounters:  05/24/17 122/82  04/10/17 134/90  04/03/17 (!) 170/96    Pulse Readings from Last 3 Encounters:  05/24/17 78  04/10/17 63  04/03/17 64    Filed Weights   05/24/17 1502  Weight: 251 lb 8 oz (114.1 kg)      Patient Care Team    Relationship Specialty Notifications Start End  [name], DO PCP - General Family Medicine  03/20/17      Lab Results  Component Value Date   CREATININE 0.69 03/20/2017   BUN 11 03/20/2017   NA 139 03/20/2017   K 4.3 03/20/2017   CL 96 03/20/2017   CO2  26 03/20/2017    Lab Results  Component Value Date   CHOL 238 (H) 03/20/2017   CHOL 216 (A) 02/09/2015    Lab Results  Component Value Date   HDL 51 03/20/2017   HDL 41 02/09/2015    Lab Results  Component Value Date   LDLCALC 160 (H) 03/20/2017   LDLCALC 142 02/09/2015    Lab Results  Component Value Date   TRIG 137 03/20/2017   TRIG 167 (A) 02/09/2015    Lab Results  Component Value Date   CHOLHDL 4.7 (H) 03/20/2017    No results found for: LDLDIRECT ===================================================================   Patient Active Problem  List   Diagnosis Date Noted   Acute suppurative parotitis 04/10/2017   Glucose intolerance (impaired glucose tolerance) 04/03/2017   Vitamin D deficiency 04/03/2017   HLD (hyperlipidemia) 04/03/2017   HTN, goal below 130/80 03/20/2017   History of chronic back pain- MRI done in past 03/20/2017   Peripheral edema 03/20/2017   Snoring- terrible 03/20/2017   Obesity, Class III, BMI 40-49.9 (morbid obesity) (HCC) 03/20/2017   Elevated liver enzymes 03/20/2017   Myalgia 03/20/2017   Arthralgia 03/20/2017   BV (bacterial vaginosis) 03/22/2012   Urinary catheter infection (HCC) 03/22/2012     Past Medical History:  Diagnosis Date   Abnormal Pap smear    Anxiety    Broken arm    Left    Complication of anesthesia    Epidural- baby's HR decreased ; developed  fever   Fatigue 2008   H/O dyspareunia 01/23/11   H/O varicella    Hx of candidiasis    Hx: UTI (urinary tract infection)    Hypertension    Increased BMI    Irregular periods/menstrual cycles 2008   Postpartum depression    Vaginal lesion 2009    Painful   Vaginal pain 01/23/11     Past Surgical History:  Procedure Laterality Date   repair broken arm     TONSILLECTOMY     Age 8   TUBAL LIGATION     WISDOM TOOTH EXTRACTION       Family History  Problem Relation Age of Onset   Hypertension Mother    Diabetes Mother    Cancer Mother        skin   Leukemia Mother    Diabetes Father    Hypertension Father    COPD Father    Stroke Maternal Grandmother      History  Drug Use No  ,  History  Alcohol Use   0.5 oz/week   1 Standard drinks or equivalent per week    Comment: social  ,  History  Smoking Status   Never Smoker  Smokeless Tobacco   Never Used  ,    Current Outpatient Prescriptions on File Prior to Visit  Medication Sig Dispense Refill   aspirin (BAYER LOW DOSE) 81 MG EC tablet Take 81 mg by mouth daily. Swallow whole.     Cholecalciferol (VITAMIN D3) 5000 units TABS 5,000 IU OTC  vitamin D3 daily. (Patient taking differently: Take 2 tablets by mouth daily. 5,000 IU OTC vitamin D3 daily.) 90 tablet 3   hydrochlorothiazide (HYDRODIURIL) 12.5 MG tablet Take 1 tablet (12.5 mg total) by mouth daily. 90 tablet 1   losartan (COZAAR) 100 MG tablet Take 0.5 tablets (50 mg total) by mouth daily. 90 tablet 0   pantoprazole (PROTONIX) 40 MG tablet Take 40 mg by mouth daily.      Vitamin D, Ergocalciferol, (DRISDOL)  50000 units CAPS capsule Take 1 capsule (50,000 Units total) by mouth every 7 (seven) days. 12 capsule 10   No current facility-administered medications on file prior to visit.      No Known Allergies   Review of Systems:   General:  Denies fever, chills Optho/Auditory:   Denies visual changes, blurred vision Respiratory:   Denies SOB, cough, wheeze, DIB  Cardiovascular:   Denies chest pain, palpitations, painful respirations Gastrointestinal:   Denies nausea, vomiting, diarrhea.  Endocrine:     Denies new hot or cold intolerance Musculoskeletal:  Denies joint swelling, gait issues, or new unexplained myalgias/ arthralgias Skin:  Denies rash, suspicious lesions  Neurological:    Denies dizziness, unexplained weakness, numbness  Psychiatric/Behavioral:   Denies mood changes  Objective:    Blood pressure 122/82, pulse 78, height 5' 2.25" (1.581 m), weight 251 lb 8 oz (114.1 kg).  Body mass index is 45.63 kg/m.  General: Well Developed, well nourished, and in no acute distress.  HEENT: Normocephalic, atraumatic, pupils equal round reactive to light, neck supple, No carotid bruits, no JVD Skin: Warm and dry, cap RF less 2 sec Cardiac: Regular rate and rhythm, S1, S2 WNL's, no murmurs rubs or gallops Respiratory: ECTA B/L, Not using accessory muscles, speaking in full sentences. NeuroM-Sk: Ambulates w/o assistance, moves ext * 4 w/o difficulty, sensation grossly intact.  Ext: scant edema b/l lower ext Psych: No HI/SI, judgement and insight good, Euthymic  mood. Full Affect.

## 2017-05-24 NOTE — Patient Instructions (Addendum)
Early Nov- f/up for wt mgt OV with me.  If you come in around November 8, that would be about 7 pounds if our goal was 1 pound per week.    - 4 more aggressive goal it would be 5% of your weight every month.  Behavior Modification Ideas for Weight Management  Weight management involves adopting a healthy lifestyle that includes a knowledge of nutrition and exercise, a positive attitude and the right kind of motivation. Internal motives such as better health, increased energy, self-esteem and personal control increase your chances of lifelong weight management success.  Remember to have realistic goals and think long-term success. Believe in yourself and you can do it. The following information will give you ideas to help you meet your goals.  Control Your Home Environment  Eat only while sitting down at the kitchen or dining room table. Do not eat while watching television, reading, cooking, talking on the phone, standing at the refrigerator or working on the computer. Keep tempting foods out of the house - don't buy them. Keep tempting foods out of sight. Have low-calorie foods ready to eat. Unless you are preparing a meal, stay out of the kitchen. Have healthy snacks at your disposal, such as small pieces of fruit, vegetables, canned fruit, pretzels, low-fat string cheese and nonfat cottage cheese.  Control Your Work Environment  Do not eat at [employer] or keep tempting snacks at your desk. If you get hungry between meals, plan healthy snacks and bring them with you to work. During your breaks, go for a walk instead of eating. If you work around food, plan in advance the one item you will eat at mealtime. Make it inconvenient to nibble on food by chewing gum, sugarless candy or drinking water or another low-calorie beverage. Do not work through meals. Skipping meals slows down metabolism and may result in overeating at the next meal. If food is available for special occasions, either  pick the healthiest item, nibble on low-fat snacks brought from home, don't have anything offered, choose one option and have a small amount, or have only a beverage.  Control Your Mealtime Environment  Serve your plate of food at the stove or kitchen counter. Do not put the serving dishes on the table. If you do put dishes on the table, remove them immediately when finished eating. Fill half of your plate with vegetables, a quarter with lean protein and a quarter with starch. Use smaller plates, bowls and glasses. A smaller portion will look large when it is in a little dish. Politely refuse second helpings. When fixing your plate, limit portions of food to one scoop/serving or less.   Daily Food Management  Replace eating with another activity that you will not associate with food. Wait 20 minutes before eating something you are craving. Drink a large glass of water or diet soda before eating. Always have a big glass or bottle of water to drink throughout the day. Avoid high-calorie add-ons such as cream with your coffee, butter, mayonnaise and salad dressings.  Shopping: Do not shop when hungry or tired. Shop from a list and avoid buying anything that is not on your list. If you must have tempting foods, buy individual-sized packages and try to find a lower-calorie alternative. Don't taste test in the store. Read food labels. Compare products to help you make the healthiest choices.  Preparation: Chew a piece of gum while cooking meals. Use a quarter teaspoon if you taste test your food. Try  to only fix what you are going to eat, leaving yourself no chance for seconds. If you have prepared more food than you need, portion it into individual containers and freeze or refrigerate immediately. Don't snack while cooking meals.  Eating: Eat slowly. Remember it takes about 20 minutes for your stomach to send a message to your brain that it is full. Don't let fake hunger make you think  you need more. The ideal way to eat is to take a bite, put your utensil down, take a sip of water, cut your next bite, take a bit, put your utensil down and so on. Do not cut your food all at one time. Cut only as needed. Take small bites and chew your food well. Stop eating for a minute or two at least once during a meal or snack. Take breaks to reflect and have conversation.  Cleanup and Leftovers: Label leftovers for a specific meal or snack. Freeze or refrigerate individual portions of leftovers. Do not clean up if you are still hungry.  Eating Out and Social Eating  Do not arrive hungry. Eat something light before the meal. Try to fill up on low-calorie foods, such as vegetables and fruit, and eat smaller portions of the high-calorie foods. Eat foods that you like, but choose small portions. If you want seconds, wait at least 20 minutes after you have eaten to see if you are actually hungry or if your eyes are bigger than your stomach. Limit alcoholic beverages. Try a soda water with a twist of lime. Do not skip other meals in the day to save room for the special event.  At Restaurants: Order  la carte rather than buffet style. Order some vegetables or a salad for an appetizer instead of eating bread. If you order a high-calorie dish, share it with someone. Try an after-dinner mint with your coffee. If you do have dessert, share it with two or more people. Don't overeat because you do not want to waste food. Ask for a doggie bag to take extra food home. Tell the server to put half of your entree in a to go bag before the meal is served to you. Ask for salad dressing, gravy or high-fat sauces on the side. Dip the tip of your fork in the dressing before each bite. If bread is served, ask for only one piece. Try it plain without butter or oil. At [employer] where oil and vinegar is served with bread, use only a small amount of oil and a lot of vinegar for dipping.  At a  Friend's House: Offer to bring a dish, appetizer or dessert that is low in calories. Serve yourself small portions or tell the host that you only want a small amount. Stand or sit away from the snack table. Stay away from the kitchen or stay busy if you are near the food. Limit your alcohol intake.  At [employer] and Cafeterias: Cover most of your plate with lettuce and/or vegetables. Use a salad plate instead of a dinner plate. After eating, clear away your dishes before having coffee or tea.  Entertaining at Home: Explore low-fat, low-cholesterol cookbooks. Use single-serving foods like chicken breasts or hamburger patties. Prepare low-calorie appetizers and desserts.   Holidays: Keep tempting foods out of sight. Decorate the house without using food. Have low-calorie beverages and foods on hand for guests. Allow yourself one planned treat a day. Don't skip meals to save up for the holiday feast. Eat regular, planned meals.  Exercise Well  Make exercise a priority and a planned activity in the day. If possible, walk the entire or part of the distance to work. Get an exercise buddy. Go for a walk with a colleague during one of your breaks, go to the gym, run or take a walk with a friend, walk in the mall with a shopping companion. Park at the end of the parking lot and walk to the store or office entrance. Always take the stairs all of the way or at least part of the way to your floor. If you have a desk job, walk around the office frequently. Do leg lifts while sitting at your desk. Do something outside on the weekends like going for a hike or a bike ride.   Have a Healthy Attitude  Make health your weight management priority. Be realistic. Have a goal to achieve a healthier you, not necessarily the lowest weight or ideal weight based on calculations or tables. Focus on a healthy eating style, not on dieting. Dieting usually lasts for a short amount of time and rarely  produces long-term success. Think long term. You are developing new healthy behaviors to follow next month, in a year and in a decade.    This information is for educational purposes only and is not intended to replace the advice of your doctor or health care provider. We encourage you to discuss with your doctor any questions or concerns you may have.        Guidelines for Losing Weight   We want weight loss that will last so you should lose 1-2 pounds a week.  THAT IS IT! Please pick THREE things a month to change. Once it is a habit check off the item. Then pick another three items off the list to become habits.  If you are already doing a habit on the list GREAT!  Cross that item off!  Don't drink your calories. Ie, alcohol, soda, fruit juice, and sweet tea.   Drink more water. Drink a glass when you feel hungry or before each meal.   Eat breakfast - Complex carb and protein (likeDannon light and fit yogurt, oatmeal, fruit, eggs, [place] bacon).  Measure your cereal.  Eat no more than one cup a day. (ie Kashi)  Eat an apple a day.  Add a vegetable a day.  Try a new vegetable a month.  Use Pam! Stop using oil or butter to cook.  Don't finish your plate or use smaller plates.  Share your dessert.  Eat sugar free Jello for dessert or frozen grapes.  Don't eat 2-3 hours before bed.  Switch to whole wheat bread, pasta, and brown rice.  Make healthier choices when you eat out. No fries!  Pick baked chicken, NOT fried.  Don't forget to SLOW DOWN when you eat. It is not going anywhere.   Take the stairs.  Park far away in the parking lot  Lift soup cans (or weights) for 10 minutes while watching TV.  Walk at work for 10 minutes during break.  Walk outside 1 time a week with your friend, kids, dog, or significant other.  Start a walking group at church.  Walk the mall as much as you can tolerate.   Keep a food diary.  Weigh yourself daily.  Walk for 15  minutes 3 days per week.  Cook at home more often and eat out less. If life happens and you go back to old habits, it is okay.  Just  start over. You can do it!  If you experience chest pain, get short of breath, or tired during the exercise, please stop immediately and inform your doctor.    Before you even begin to attack a weight-loss plan, it pays to remember this: You are not fat. You have fat. Losing weight isn't about blame or shame; it's simply another achievement to accomplish. Dieting is like any other skill-you have to buckle down and work at it. As long as you act in a smart, reasonable way, you'll ultimately get where you want to be. Here are some weight loss pearls for you.   1. It's Not a Diet. It's a Lifestyle Thinking of a diet as something you're on and suffering through only for the short term doesn't work. To shed weight and keep it off, you need to make permanent changes to the way you eat. It's OK to indulge occasionally, of course, but if you cut calories temporarily and then revert to your old way of eating, you'll gain back the weight quicker than you can say yo-yo. Use it to lose it. Research shows that one of the best predictors of long-term weight loss is how many pounds you drop in the first month. For that reason, nutritionists often suggest being stricter for the first two weeks of your new eating strategy to build momentum. Cut out added sugar and alcohol and avoid unrefined carbs. After that, figure out how you can reincorporate them in a way that's healthy and maintainable.  2. There's a Right Way to Exercise Working out burns calories and fat and boosts your metabolism by building muscle. But those trying to lose weight are notorious for overestimating the number of calories they burn and underestimating the amount they take in. Unfortunately, your system is biologically programmed to hold on to extra pounds and that means when you start exercising, your body senses  the deficit and ramps up its hunger signals. If you're not diligent, you'll eat everything you burn and then some. Use it, to lose it. Cardio gets all the exercise glory, but strength and interval training are the real heroes. They help you build lean muscle, which in turn increases your metabolism and calorie-burning ability 3. Don't Overreact to Mild Hunger Some people have a hard time losing weight because of hunger anxiety. To them, being hungry is bad-something to be avoided at all costs-so they carry snacks with them and eat when they don't need to. Others eat because they're stressed out or bored. While you never want to get to the point of being ravenous (that's when bingeing is likely to happen), a hunger pang, a craving, or the fact that it's 3:00 p.m. should not send you racing for the vending machine or obsessing about the energy bar in your purse. Ideally, you should put off eating until your stomach is growling and it's difficult to concentrate.  Use it to lose it. When you feel the urge to eat, use the HALT method. Ask yourself, Am I really hungry? Or am I angry or anxious, lonely or bored, or tired? If you're still not certain, try the apple test. If you're truly hungry, an apple should seem delicious; if it doesn't, something else is going on. Or you can try drinking water and making yourself busy, if you are still hungry try a healthy snack.  4. Not All Calories Are Created Equal The mechanics of weight loss are pretty simple: Take in fewer calories than you use for energy. But  the kind of food you eat makes all the difference. Processed food that's high in saturated fat and refined starch or sugar can cause inflammation that disrupts the hormone signals that tell your brain you're full. The result: You eat a lot more.  Use it to lose it. Clean up your diet. Swap in whole, unprocessed foods, including vegetables, lean protein, and healthy fats that will fill you up and give you the biggest  nutritional bang for your calorie buck. In a few weeks, as your brain starts receiving regular hunger and fullness signals once again, you'll notice that you feel less hungry overall and naturally start cutting back on the amount you eat.  5. Protein, Produce, and Plant-Based Fats Are Your Weight-Loss Trinity Here's why eating the three Ps regularly will help you drop pounds. Protein fills you up. You need it to build lean muscle, which keeps your metabolism humming so that you can torch more fat. People in a weight-loss program who ate double the recommended daily allowance for protein (about 110 grams for a 150-pound woman) lost 70 percent of their weight from fat, while people who ate the RDA lost only about 40 percent, one study found. Produce is packed with filling fiber. "It's very difficult to consume too many calories if you're eating a lot of vegetables. Example: Three cups of broccoli is a lot of food, yet only 93 calories. (Fruit is another story. It can be easy to overeat and can contain a lot of calories from sugar, so be sure to monitor your intake.) Plant-based fats like olive oil and those in avocados and nuts are healthy and extra satiating.  Use it to lose it. Aim to incorporate each of the three Ps into every meal and snack. People who eat protein throughout the day are able to keep weight off, according to a study in the American Journal of Clinical Nutrition. In addition to meat, poultry and seafood, good sources are beans, lentils, eggs, tofu, and yogurt. As for fat, keep portion sizes in check by measuring out salad dressing, oil, and nut butters (shoot for one to two tablespoons). Finally, eat veggies or a little fruit at every meal. People who did that consumed 308 fewer calories but didn't feel any hungrier than when they didn't eat more produce.  7. How You Eat Is As Important As What You Eat In order for your brain to register that you're full, you need to focus on what you're  eating. Sit down whenever you eat, preferably at a table. Turn off the TV or computer, put down your phone, and look at your food. Smell it. Chew slowly, and don't put another bite on your fork until you swallow. When women ate lunch this attentively, they consumed 30 percent less when snacking later than those who listened to an audiobook at lunchtime, according to a study in the [place] Journal of Nutrition. 8. Weighing Yourself Really Works The scale provides the best evidence about whether your efforts are paying off. Seeing the numbers tick up or down or stagnate is motivation to keep going-or to rethink your approach. A 2015 study at [facility] found that daily weigh-ins helped people lose more weight, keep it off, and maintain that loss, even after two years. Use it to lose it. Step on the scale at the same time every day for the best results. If your weight shoots up several pounds from one weigh-in to the next, don't freak out. Eating a lot of salt the  night before or having your period is the likely culprit. The number should return to normal in a day or two. It's a steady climb that you need to do something about. 9. Too Much Stress and Too Little Sleep Are Your Enemies When you're tired and frazzled, your body cranks up the production of cortisol, the stress hormone that can cause carb cravings. Not getting enough sleep also boosts your levels of ghrelin, a hormone associated with hunger, while suppressing leptin, a hormone that signals fullness and satiety. People on a diet who slept only five and a half hours a night for two weeks lost 55 percent less fat and were hungrier than those who slept eight and a half hours, according to a study in the [place] [employer] Journal. Use it to lose it. Prioritize sleep, aiming for seven hours or more a night, which research shows helps lower stress. And make sure you're getting quality zzz's. If a snoring spouse or a fidgety cat wakes you  up frequently throughout the night, you may end up getting the equivalent of just four hours of sleep, according to a study from [facility]. Keep pets out of the bedroom, and use a white-noise app to drown out snoring. 10. You Will Hit a plateau-And You Can Bust Through It As you slim down, your body releases much less leptin, the fullness hormone.  If you're not strength training, start right now. Building muscle can raise your metabolism to help you overcome a plateau. To keep your body challenged and burning calories, incorporate new moves and more intense intervals into your workouts or add another sweat session to your weekly routine. Alternatively, cut an extra 100 calories or so a day from your diet. Now that you've lost weight, your body simply doesn't need as much fuel.    Since food equals calories, in order to lose weight you must either eat fewer calories, exercise more to burn off calories with activity, or both. Food that is not used to fuel the body is stored as fat. A major component of losing weight is to make smarter food choices. Here's how:  1)   Limit non-nutritious foods, such as: Sugar, honey, syrups and candy Pastries, donuts, pies, cakes and cookies Soft drinks, sweetened juices and alcoholic beverages  2)  Cut down on high-fat foods by: - Choosing poultry, fish or lean red meat - Choosing low-fat cooking methods, such as baking, broiling, steaming, grilling and boiling - Using low-fat or non-fat dairy products - Using vinaigrette, herbs, lemon or fat-free salad dressings - Avoiding fatty meats, such as bacon, sausage, franks, ribs and luncheon meats - Avoiding high-fat snacks like nuts, chips and chocolate - Avoiding fried foods - Using less butter, margarine, oil and mayonnaise - Avoiding high-fat gravies, cream sauces and cream-based soups  3) Eat a variety of foods, including: - Fruit and vegetables that are raw, steamed or baked - Whole grains,  breads, cereal, rice and pasta - Dairy products, such as low-fat or non-fat milk or yogurt, low-fat cottage cheese and low-fat cheese - Protein-rich foods like chicken, [place], fish, lean meat and legumes, or beans  4) Change your eating habits by: - Eat three balanced meals a day to help control your hunger - Watch portion sizes and eat small servings of a variety of foods - Choose low-calorie snacks - Eat only when you are hungry and stop when you are satisfied - Eat slowly and try not to perform other tasks while eating -  Find other activities to distract you from food, such as walking, taking up a hobby or being involved in the community - Include regular exercise in your daily routine ( minimum of 20 min of moderate-intensity exercise at least 5 days/week)  - Find a support group, if necessary, for emotional support in your weight loss journey           Easy ways to cut 100 calories   1. Eat your eggs with hot sauce OR salsa instead of cheese.  Eggs are great for breakfast, but many people consider eggs and cheese to be BFFs. Instead of cheese-1 oz. of cheddar has 114 calories-top your eggs with hot sauce, which contains no calories and helps with satiety and metabolism. Salsa is also a great option!!  2. Top your toast, waffles or pancakes with fresh berries instead of jelly or syrup. Half a cup of berries-fresh, frozen or thawed-has about 40 calories, compared with 2 tbsp. of maple syrup or jelly, which both have about 100 calories. The berries will also give you a good punch of fiber, which helps keep you full and satisfied and won't spike blood sugar quickly like the jelly or syrup. 3. Swap the non-fat latte for black coffee with a splash of half-and-half. Contrary to its name, that non-fat latte has 130 calories and a startling 19g of carbohydrates per 16 oz. serving. Replacing that 'light' drinkable dessert with a black coffee with a splash of half-and-half saves you more  than 100 calories per 16 oz. serving. 4. Sprinkle salads with freeze-dried raspberries instead of dried cranberries. If you want a sweet addition to your nutritious salad, stay away from dried cranberries. They have a whopping 130 calories per  cup and 30g carbohydrates. Instead, sprinkle freeze-dried raspberries guilt-free and save more than 100 calories per  cup serving, adding 3g of belly-filling fiber. 5. Go for mustard in place of mayo on your sandwich. Mustard can add really nice flavor to any sandwich, and there are tons of varieties, from spicy to honey. A serving of mayo is 95 calories, versus 10 calories in a serving of mustard.  Or try an avocado mayo spread: You can find the recipe few click this link: https://www.californiaavocado.com/recipes/recipe-container/california-avocado-mayo 6. Choose a DIY salad dressing instead of the store-bought kind. Mix Dijon or whole grain mustard with low-fat Kefir or red wine vinegar and garlic. 7. Use hummus as a spread instead of a dip. Use hummus as a spread on a high-fiber cracker or tortilla with a sandwich and save on calories without sacrificing taste. 8. Pick just one salad "accessory." Salad isn't automatically a calorie winner. It's easy to over-accessorize with toppings. Instead of topping your salad with nuts, avocado and cranberries (all three will clock in at 313 calories), just pick one. The next day, choose a different accessory, which will also keep your salad interesting. You don't wear all your jewelry every day, right? 9. Ditch the white pasta in favor of spaghetti squash. One cup of cooked spaghetti squash has about 40 calories, compared with traditional spaghetti, which comes with more than 200. Spaghetti squash is also nutrient-dense. It's a good source of fiber and Vitamins A and C, and it can be eaten just like you would eat pasta-with a great tomato sauce and [place] meatballs or with pesto, tofu and spinach, for example. 10.  Dress up your chili, soups and stews with non-fat Greek yogurt instead of sour cream. Just a 'dollop' of sour cream can set you back 115  calories and a whopping 12g of fat-seven of which are of the artery-clogging variety. Added bonus: [place] yogurt is packed with muscle-building protein, calcium and B Vitamins. 11. Mash cauliflower instead of mashed potatoes. One cup of traditional mashed potatoes-in all their creamy goodness-has more than 200 calories, compared to mashed cauliflower, which you can typically eat for less than 100 calories per 1 cup serving. Cauliflower is a great source of the antioxidant indole-3-carbinol (I3C), which may help reduce the risk of some cancers, like breast cancer. 12. Ditch the ice cream sundae in favor of a [place] yogurt parfait. Instead of a cup of ice cream or fro-yo for dessert, try 1 cup of nonfat Greek yogurt topped with fresh berries and a sprinkle of cacao nibs. Both toppings are packed with antioxidants, which can help reduce cellular inflammation and oxidative damage. And the comparison is a no-brainer: One cup of ice cream has about 275 calories; one cup of frozen yogurt has about 230; and a cup of Greek yogurt has just 130, plus twice the protein, so you're less likely to return to the freezer for a second helping. 13. Put olive oil in a spray container instead of using it directly from the bottle. Each tablespoon of olive oil is 120 calories and 15g of fat. Use a mister instead of pouring it straight into the pan or onto a salad. This allows for portion control and will save you more than 100 calories. 14. When baking, substitute canned pumpkin for butter or oil. Canned pumpkin-not pumpkin pie mix-is loaded with Vitamin A, which is important for skin and eye health, as well as immunity. And the comparisons are pretty crazy:  cup of canned pumpkin has about 40 calories, compared to butter or oil, which has more than 800 calories. Yes, 800 calories. Applesauce and  mashed banana can also serve as good substitutions for butter or oil, usually in a 1:1 ratio. 15. Top casseroles with high-fiber cereal instead of breadcrumbs. Breadcrumbs are typically made with white bread, while breakfast cereals contain 5-9g of fiber per serving. Not only will you save more than 150 calories per  cup serving, the swap will also keep you more full and you'll get a metabolism boost from the added fiber. 16. Snack on pistachios instead of macadamia nuts. Believe it or not, you get the same amount of calories from 35 pistachios (100 calories) as you would from only five macadamia nuts. 17. Chow down on kale chips rather than potato chips. This is my favorite 'don't knock it 'till you try it' swap. Kale chips are so easy to make at home, and you can spice them up with a little grated parmesan or chili powder. Plus, they're a mere fraction of the calories of potato chips, but with the same crunch factor we crave so often. 18. Add seltzer and some fruit slices to your cocktail instead of soda or fruit juice. One cup of soda or fruit juice can pack on as much as 140 calories. Instead, use seltzer and fruit slices. The fruit provides valuable phytochemicals, such as flavonoids and anthocyanins, which help to combat cancer and stave off the aging process.

## 2017-07-12 ENCOUNTER — Ambulatory Visit: Payer: No Typology Code available for payment source | Admitting: Family Medicine

## 2017-09-05 ENCOUNTER — Ambulatory Visit: Payer: No Typology Code available for payment source | Admitting: Family Medicine

## 2017-09-05 ENCOUNTER — Encounter: Payer: Self-pay | Admitting: Family Medicine

## 2017-09-05 VITALS — BP 152/104 | HR 93 | Temp 98.3°F | Ht 65.25 in | Wt 260.2 lb

## 2017-09-05 DIAGNOSIS — I10 Essential (primary) hypertension: Secondary | ICD-10-CM | POA: Diagnosis not present

## 2017-09-05 DIAGNOSIS — R05 Cough: Secondary | ICD-10-CM | POA: Diagnosis not present

## 2017-09-05 DIAGNOSIS — J0140 Acute pansinusitis, unspecified: Secondary | ICD-10-CM | POA: Diagnosis not present

## 2017-09-05 DIAGNOSIS — R059 Cough, unspecified: Secondary | ICD-10-CM

## 2017-09-05 MED ORDER — AMOXICILLIN-POT CLAVULANATE 875-125 MG PO TABS: 1.0000 | ORAL_TABLET | Freq: Two times a day (BID) | ORAL | 0 refills | Status: AC

## 2017-09-05 MED ORDER — HYDROCOD POLST-CPM POLST ER 10-8 MG/5ML PO SUER: 5.0000 mL | Freq: Two times a day (BID) | ORAL | 0 refills | Status: DC | PRN

## 2017-09-05 NOTE — Progress Notes (Signed)
Acute Care Office visit  Assessment and plan:  1. Acute pansinusitis, recurrence not specified   2. HTN, goal below 130/80   3. Cough in adult     1.  - Viral vs Allergic vs Bacterial causes for pt's symptoms reviewed.    - Will prescribe Augmentin and tussionex due to the prolonged bacterial sinusitis symptoms of over 10 days to aid with the patient sinus issues  -Will prescribe the patient prednisone if her symptoms have not improved with the above regimen.  -Discussed and advised use of chloraseptic spray, nettie pot/saline rinses or AYR saline rinses to aid with congestion followed by one spray of Flonase to each nostril.  -Advised the patient to use a humidifier at night to aid with dry heat in her home and her symptoms.  -Advised the patient to push fluids and increase rest -Advised the patient to not use Afrin longer than 3 days. -Informed that the patient discontinue use of astelin Rx spray.     2.  -Advised the patient to monitor blood pressure at home and continue on antihypertensive medications -Will recheck blood pressure at home today.   Gross side effects, risk and benefits, and alternatives of medications discussed with patient.  Patient is aware that all medications have potential side effects and we are unable to predict every sideeffect or drug-drug interaction that may occur.  Expresses verbal understanding and consents to current therapy plan and treatment regiment.   Education and routine counseling performed. Handouts provided.  No orders of the defined types were placed in this encounter.   Meds ordered this encounter  Medications  . chlorpheniramine-HYDROcodone (TUSSIONEX) 10-8 MG/5ML SUER    Sig: Take 5 mLs by mouth every 12 (twelve) hours as needed for cough (cough, will cause drowsiness.).    Dispense:  200 mL    Refill:  0  . amoxicillin-clavulanate (AUGMENTIN) 875-125 MG tablet    Sig: Take 1 tablet by mouth 2 (two) times daily for 10 days.    Dispense:  20 tablet    Refill:  0    Anticipatory guidance and routine counseling done re: condition, txmnt options and need for follow up. All questions of patient's were answered.    No Follow-up on file.  Please see AVS handed out to patient at the end of our visit for additional patient instructions/ counseling done pertaining to today's office visit.  Note: This document was prepared using Dragon voice recognition software and may include unintentional dictation errors.  This document serves as a record of services personally performed by Thomasene Loteborah Kassidy Dockendorf, DO. It was created on her behalf by Chestine SporeSoijett Blue, a trained medical scribe. The creation of this record is based on the scribe's personal observations and the provider's statements to them.   I have reviewed the above documentation for accuracy and completeness, and I agree with the above.   Thomasene LotDeborah Essence Merle 09/05/17 4:26 PM    Subjective:    Chief Complaint  Patient presents with  . Sinus Problem    sinus pain and pressure, non productive cough, headache, ears stopped up x 1.5 wks    HPI:  Pt presents with Sx for 1.5 weeks     C/o: She has had sensation of her bilateral ears being stopped up, sinus pain/pressure, mild intermittent non-productive cough, post nasal drainage, head congestion, facial pain, fatigue due to symptoms, mild SOB, and jittery sensation to chest.   Denies:  objective F/C,   No face pain or ear pain,  No N/V/D,   No DIB, no Cough or  Pleuritic CP,  No Rash.    For symptoms patient has tried:  Rx Astelin, norel Rx, saline spray, and 3 days of Afrin with no relief of her symptoms. She notes that she is not able to use a nettie pot due to vomiting following application.   Overall getting:   She notes that her symptoms are worsening at this time.      Past medical history, Surgical history, Family history reviewed and noted below, Social history, Allergies, and Medications have been entered into  the medical record, reviewed and changed as needed.   No Known Allergies  Review of Systems: General:   No F/C, wt loss Pulm:   No DIB, pleuritic chest pain Card:  No CP, palpitations Abd:  No n/v/d or pain Ext:  No inc edema from baseline   Objective:   Blood pressure (!) 152/104, pulse 93, temperature 98.3 F (36.8 C), height 5' 5.25" (1.657 m), weight 260 lb 3.2 oz (118 kg), last menstrual period 08/22/2017, SpO2 99 %. Body mass index is 42.97 kg/m. General: Well Developed, well nourished, appropriate for stated age.  Neuro: Alert and oriented x3, extra-ocular muscles intact, sensation grossly intact.  HEENT: Normocephalic, atraumatic, pupils equal round reactive to light, neck supple, no masses, no painful lymphadenopathy, TM's intact B/L, no acute findings. Nares- patent, clear d/c, OP- clear, mild erythema, TTP to right frontal sinus and bilateral maxillary.  Skin: Warm and dry, no gross rash. Cardiac: RRR, S1 S2,  no murmurs rubs or gallops.  Respiratory: ECTA B/L and A/P, Not using accessory muscles, speaking in full sentences- unlabored. Vascular:  No gross lower ext edema, cap RF less 2 sec. Psych: No HI/SI, judgement and insight good, Euthymic mood. Full Affect.   Patient Care Team    Relationship Specialty Notifications Start End  [name], DO PCP - General Family Medicine  03/20/17

## 2017-09-05 NOTE — Patient Instructions (Signed)
Please go to the pharmacy and buy over-the-counter Chloraseptic spray.  Use 3-5 sprays every 30 minutes as needed for sore throat.  Also please buy a over-the-counter sinus rinse such as AYR or Lloyd Huger med and do sinus rinses twice daily, followed by 1 spray of Flonase each nostril twice daily.  Please continue the Flonase for at least a month.  Use Tussionex as needed for cough and please finish entire prescription of the Augmentin for treatment of bacterial sinusitis     Sinusitis, Adult Sinusitis is soreness and inflammation of your sinuses. Sinuses are hollow spaces in the bones around your face. Your sinuses are located:  Around your eyes.  In the middle of your forehead.  Behind your nose.  In your cheekbones.  Your sinuses and nasal passages are lined with a stringy fluid (mucus). Mucus normally drains out of your sinuses. When your nasal tissues become inflamed or swollen, the mucus can become trapped or blocked so air cannot flow through your sinuses. This allows bacteria, viruses, and funguses to grow, which leads to infection. Sinusitis can develop quickly and last for 7?10 days (acute) or for more than 12 weeks (chronic). Sinusitis often develops after a cold. What are the causes? This condition is caused by anything that creates swelling in the sinuses or stops mucus from draining, including:  Allergies.  Asthma.  Bacterial or viral infection.  Abnormally shaped bones between the nasal passages.  Nasal growths that contain mucus (nasal polyps).  Narrow sinus openings.  Pollutants, such as chemicals or irritants in the air.  A foreign object stuck in the nose.  A fungal infection. This is rare.  What increases the risk? The following factors may make you more likely to develop this condition:  Having allergies or asthma.  Having had a recent cold or respiratory tract infection.  Having structural deformities or blockages in your nose or sinuses.  Having a  weak immune system.  Doing a lot of swimming or diving.  Overusing nasal sprays.  Smoking.  What are the signs or symptoms? The main symptoms of this condition are pain and a feeling of pressure around the affected sinuses. Other symptoms include:  Upper toothache.  Earache.  Headache.  Bad breath.  Decreased sense of smell and taste.  A cough that may get worse at night.  Fatigue.  Fever.  Thick drainage from your nose. The drainage is often green and it may contain pus (purulent).  Stuffy nose or congestion.  Postnasal drip. This is when extra mucus collects in the throat or back of the nose.  Swelling and warmth over the affected sinuses.  Sore throat.  Sensitivity to light.  How is this diagnosed? This condition is diagnosed based on symptoms, a medical history, and a physical exam. To find out if your condition is acute or chronic, your health care provider may:  Look in your nose for signs of nasal polyps.  Tap over the affected sinus to check for signs of infection.  View the inside of your sinuses using an imaging device that has a light attached (endoscope).  If your health care provider suspects that you have chronic sinusitis, you may also:  Be tested for allergies.  Have a sample of mucus taken from your nose (nasal culture) and checked for bacteria.  Have a mucus sample examined to see if your sinusitis is related to an allergy.  If your sinusitis does not respond to treatment and it lasts longer than 8 weeks, you may  have an MRI or CT scan to check your sinuses. These scans also help to determine how severe your infection is. In rare cases, a bone biopsy may be done to rule out more serious types of fungal sinus disease. How is this treated? Treatment for sinusitis depends on the cause and whether your condition is chronic or acute. If a virus is causing your sinusitis, your symptoms will go away on their own within 10 days. You may be given  medicines to relieve your symptoms, including:  Topical nasal decongestants. They shrink swollen nasal passages and let mucus drain from your sinuses.  Antihistamines. These drugs block inflammation that is triggered by allergies. This can help to ease swelling in your nose and sinuses.  Topical nasal corticosteroids. These are nasal sprays that ease inflammation and swelling in your nose and sinuses.  Nasal saline washes. These rinses can help to get rid of thick mucus in your nose.  If your condition is caused by bacteria, you will be given an antibiotic medicine. If your condition is caused by a fungus, you will be given an antifungal medicine. Surgery may be needed to correct underlying conditions, such as narrow nasal passages. Surgery may also be needed to remove polyps. Follow these instructions at home: Medicines  Take, use, or apply over-the-counter and prescription medicines only as told by your health care provider. These may include nasal sprays.  If you were prescribed an antibiotic medicine, take it as told by your health care provider. Do not stop taking the antibiotic even if you start to feel better. Hydrate and Humidify  Drink enough water to keep your urine clear or pale yellow. Staying hydrated will help to thin your mucus.  Use a cool mist humidifier to keep the humidity level in your home above 50%.  Inhale steam for 10-15 minutes, 3-4 times a day or as told by your health care provider. You can do this in the bathroom while a hot shower is running.  Limit your exposure to cool or dry air. Rest  Rest as much as possible.  Sleep with your head raised (elevated).  Make sure to get enough sleep each night. General instructions  Apply a warm, moist washcloth to your face 3-4 times a day or as told by your health care provider. This will help with discomfort.  Wash your hands often with soap and water to reduce your exposure to viruses and other germs. If soap  and water are not available, use hand sanitizer.  Do not smoke. Avoid being around people who are smoking (secondhand smoke).  Keep all follow-up visits as told by your health care provider. This is important. Contact a health care provider if:  You have a fever.  Your symptoms get worse.  Your symptoms do not improve within 10 days. Get help right away if:  You have a severe headache.  You have persistent vomiting.  You have pain or swelling around your face or eyes.  You have vision problems.  You develop confusion.  Your neck is stiff.  You have trouble breathing. This information is not intended to replace advice given to you by your health care provider. Make sure you discuss any questions you have with your health care provider. Document Released: 08/21/2005 Document Revised: 04/16/2016 Document Reviewed: 06/16/2015 Elsevier Interactive Patient Education  [employer]2018 [employer]

## 2017-09-07 IMAGING — CT CT ABD-PELV W/O CM
2 of 4 series · 17 of 46 positions shown, 19 images · non-contrast
Comparison: 07/29/2014

CLINICAL DATA: Left lower quadrant abdominal pain with nausea and
vomiting for 3 days.

EXAM:
CT ABDOMEN AND PELVIS WITHOUT CONTRAST
TECHNIQUE: Multidetector CT imaging of the abdomen and pelvis was performed
following the standard protocol without IV contrast.

[Series 2: abd/pel w/o · axial · non-contrast · 0.92mm/px · z∈[-317,+63]mm · 14 of 88 slices shown, 16 images]
[im 6/88  soft-tissue]
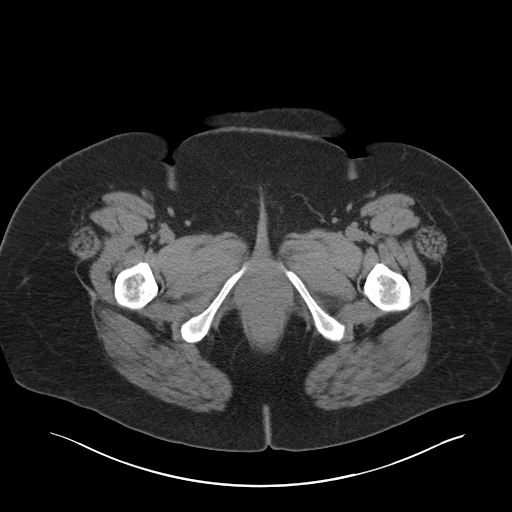
[im 6/88  bone]
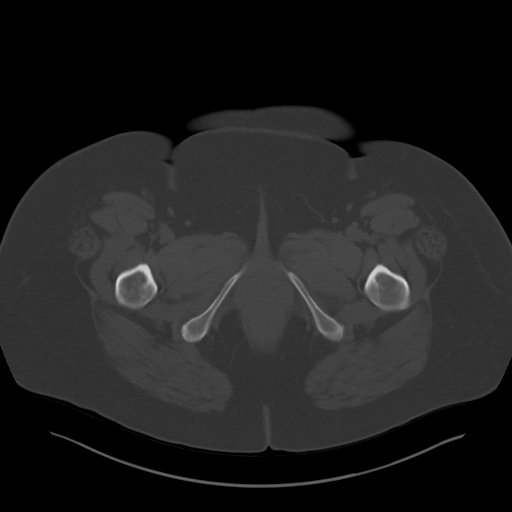
[im 11/88  soft-tissue]
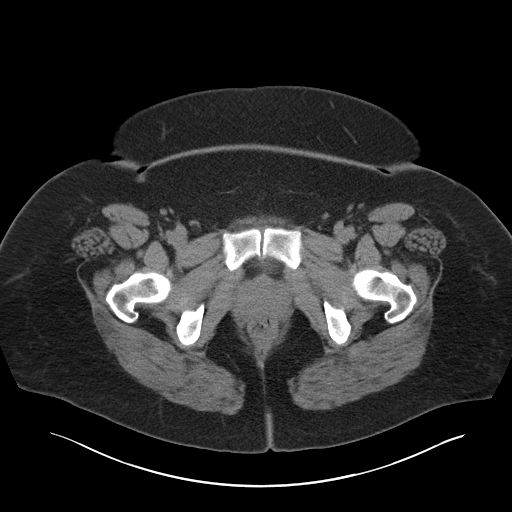
[im 16/88  soft-tissue]
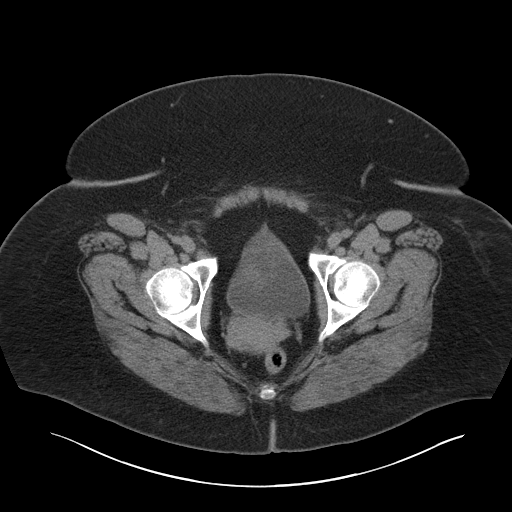
[im 26/88  soft-tissue]
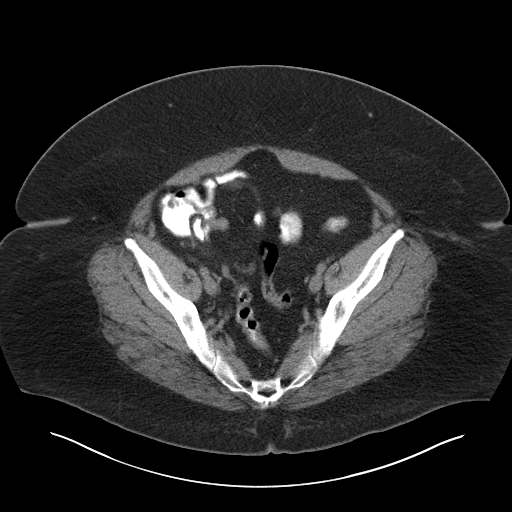
[im 31/88  soft-tissue]
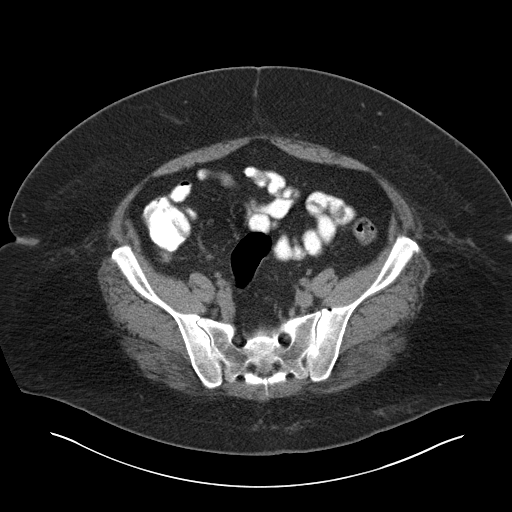
[im 36/88  soft-tissue]
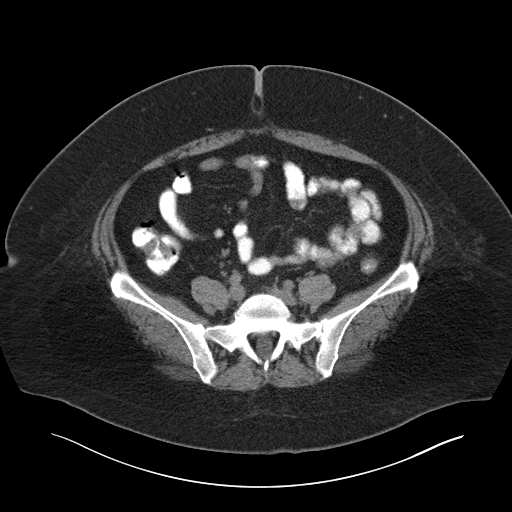
[im 41/88  soft-tissue]
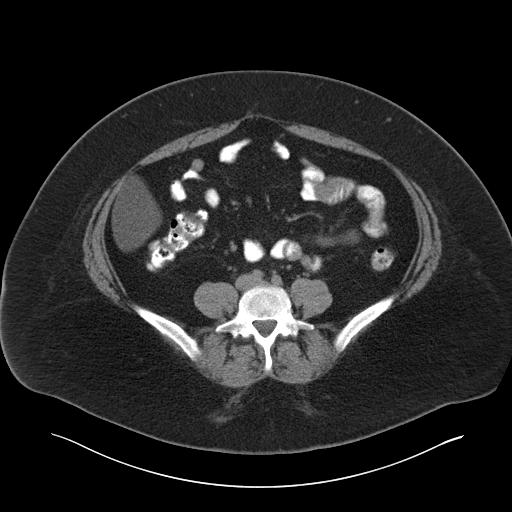
[im 47/88  soft-tissue]
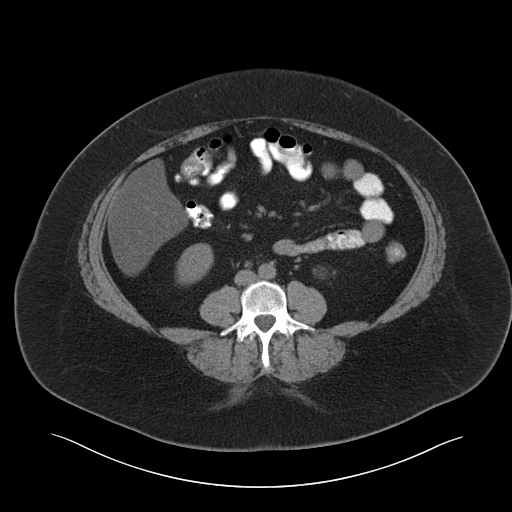
[im 52/88  soft-tissue]
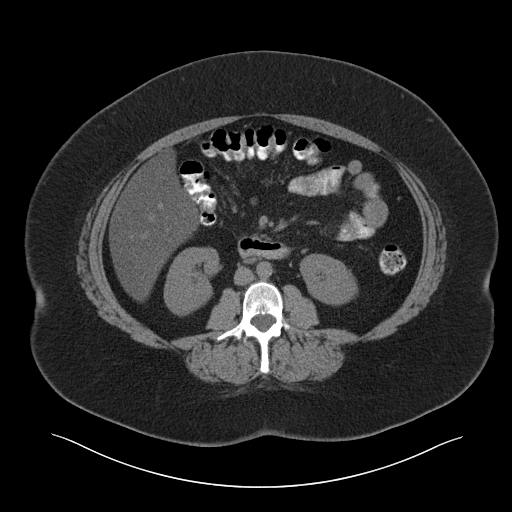
[im 52/88  bone]
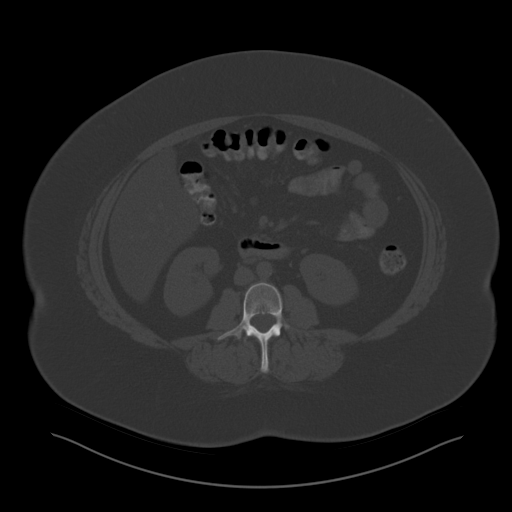
[im 57/88  soft-tissue]
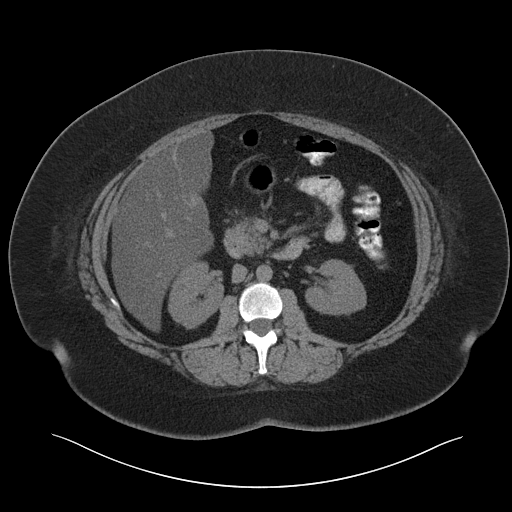
[im 67/88  soft-tissue]
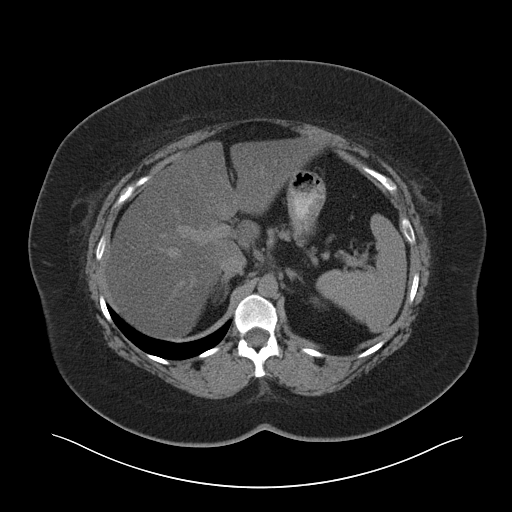
[im 72/88  soft-tissue]
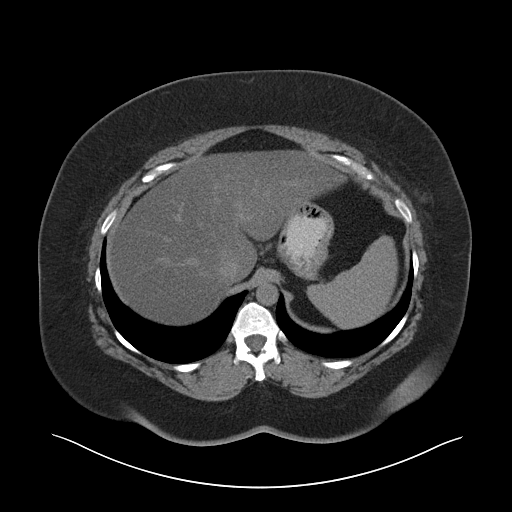
[im 77/88  soft-tissue]
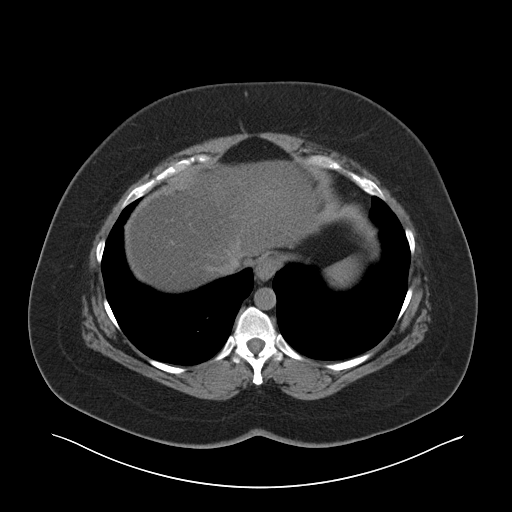
[im 82/88  soft-tissue]
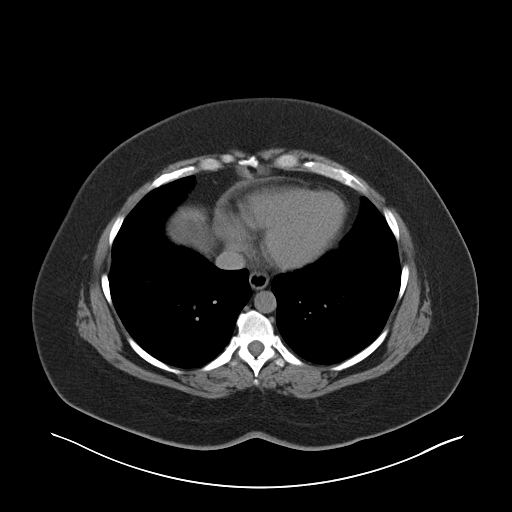

[Series 3: coronal · coronal · 0.85mm/px · 3 of 180 slices shown]
[im 60/180  soft-tissue]
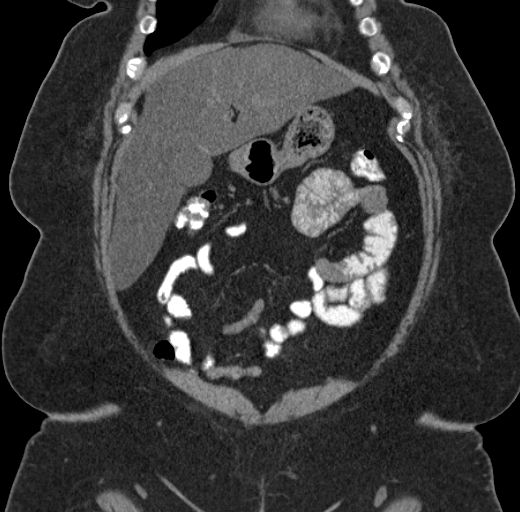
[im 80/180  soft-tissue]
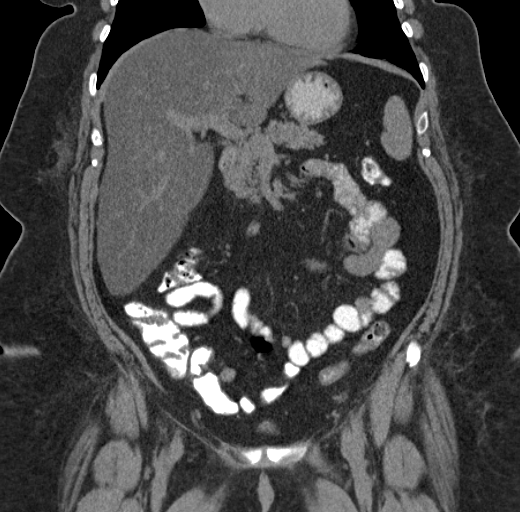
[im 100/180  soft-tissue]
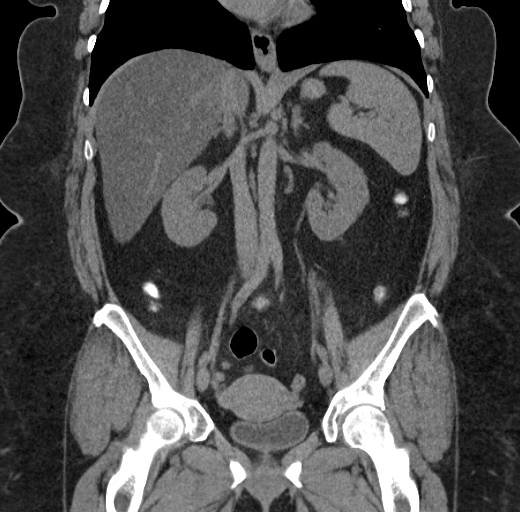

[17 of 46 positions shown; findings below may reference images not displayed]

FINDINGS: Lower chest: No acute findings.

Hepatobiliary: Severe hepatic steatosis has increased since previous
study. No focal liver mass visualized on this unenhanced exam.
Unremarkable gallbladder.

Pancreas: No mass or inflammatory process visualized on this
unenhanced exam.

Spleen:  Within normal limits in size.

Adrenals/Urinary tract: No evidence of urolithiasis or
hydronephrosis. Unremarkable appearance of bladder.

Stomach/Bowel: No evidence of obstruction, inflammatory process, or
abnormal fluid collections.

Vascular/Lymphatic: No pathologically enlarged lymph nodes
identified. No evidence of abdominal aortic aneurysm.

Reproductive:  No mass or other significant abnormality.

Other:  None.

Musculoskeletal:  No suspicious bone lesions identified.
IMPRESSION: No acute findings within the abdomen or pelvis.

Increased severe diffuse hepatic steatosis since prior study in

## 2017-10-01 ENCOUNTER — Other Ambulatory Visit: Payer: Self-pay | Admitting: Family Medicine

## 2017-10-02 ENCOUNTER — Encounter: Payer: Self-pay | Admitting: Family Medicine

## 2017-10-02 ENCOUNTER — Ambulatory Visit: Payer: No Typology Code available for payment source | Admitting: Family Medicine

## 2017-10-02 ENCOUNTER — Ambulatory Visit
Admission: RE | Admit: 2017-10-02 | Discharge: 2017-10-02 | Disposition: A | Discharge status: Home / Self Care | Payer: No Typology Code available for payment source | Source: Ambulatory Visit | Attending: Family Medicine | Admitting: Family Medicine

## 2017-10-02 VITALS — BP 138/83 | HR 82 | Ht 62.25 in | Wt 260.8 lb

## 2017-10-02 DIAGNOSIS — R0602 Shortness of breath: Secondary | ICD-10-CM | POA: Insufficient documentation

## 2017-10-02 DIAGNOSIS — R079 Chest pain, unspecified: Secondary | ICD-10-CM | POA: Insufficient documentation

## 2017-10-02 DIAGNOSIS — R071 Chest pain on breathing: Secondary | ICD-10-CM | POA: Diagnosis not present

## 2017-10-02 MED ORDER — IOPAMIDOL (ISOVUE-370) INJECTION 76%
75.0000 mL | Freq: Once | INTRAVENOUS | Status: AC | PRN
  Administered 2017-10-02: 75 mL via INTRAVENOUS

## 2017-10-02 NOTE — Progress Notes (Signed)
Pt here for an acute care OV today   Impression and Recommendations:    1. Acute chest pain   2. Painful breathing   3. SOB (shortness of breath)     - Initially, the symptoms sound musculoskeletal, especially given association with the movement of the chest wall. No lumps or lymph nodes palpable.    - CT of the chest recommended to rule out pulmonary embolism.   Orders Placed This Encounter  Procedures  . CT Angio Chest W/Cm &/Or Wo Cm     Education and routine counseling performed. Handouts provided  Gross side effects, risk and benefits, and alternatives of medications and treatment plan in general discussed with patient.  Patient is aware that all medications have potential side effects and we are unable to predict every side effect or drug-drug interaction that may occur.   Patient will call with any questions prior to using medication if they have concerns.  Expresses verbal understanding and consents to current therapy and treatment regimen.  No barriers to understanding were identified.  Red flag symptoms and signs discussed in detail.  Patient expressed understanding regarding what to do in case of emergency\urgent symptoms   Please see AVS handed out to patient at the end of our visit for further patient instructions/ counseling done pertaining to today's office visit.   Return if symptoms worsen or fail to improve.     Note: This document was prepared occasionally using Dragon voice recognition software and may include unintentional dictation errors in addition to a scribe.     This document serves as a record of services personally performed by Thomasene Loteborah Angelina Venard, DO. It was created on her behalf by Peggye FothergillKatherine Galloway, a trained medical scribe. The creation of this record is based on the scribe's personal observations and the provider's statements to them.   I have reviewed the above medical documentation for accuracy and completeness and I concur.  Thomasene LotDeborah  Rolande Moe 10/15/17 6:39 PM    ------------------------------------------------------------------------------------------------------------------------------------------------------------------------------------------------------------------------------   Subjective:    CC:  Chief Complaint  Patient presents with  . Breast Pain    pain right side of chest and breast started yesterday morning - worse with breathing and movement     HPI: Vickie [name] is a 37 y.o. female who [facility] for issues as discussed below.  Right-sided chest pain beginning yesterday morning.  Dull, located under in the axilla under her arm.  Describes the pain as "going around" to her nipple, and now it's covering her entire right breast, from under her arm, all the way to her breastbone, "literally where my boob ends."  The pain is only in that area.  The nature of the pain is as follows: When she tries to take a deep normal breath, it hurts like a sharp, stabbing pain.  Otherwise the pain is constant and aching.  She notes that the dull pain would be fine, but the sharp pains are unbearable.  When she lifts her arms or moves them back, the pain hurts just as bad as it does when she takes a deep breath.  Moving the arms back hurts worse than lifting them.  If she pushes down with her arms to adjust herself in her seat, it hurts.    Pain is unchanged while the patient is lying down.  She has not gone without breast tissue support and continues wearing a bra as she's always done.  The patient cannot  remember doing anything 24-48 hours ago that would exacerbate things.  Non-smoker, not on birth control pills, never had a blood clot.      Wt Readings from Last 3 Encounters:  10/02/17 260 lb 12.8 oz (118.3 kg)  09/05/17 260 lb 3.2 oz (118 kg)  05/24/17 251 lb 8 oz (114.1 kg)   BP Readings from Last 3 Encounters:  10/02/17 138/83  09/05/17 (!)  152/104  05/24/17 122/82   BMI Readings from Last 3 Encounters:  10/02/17 47.32 kg/m  09/05/17 42.97 kg/m  05/24/17 45.63 kg/m     Patient Care Team    Relationship Specialty Notifications Start End  [name], DO PCP - General Family Medicine  03/20/17      Patient Active Problem List   Diagnosis Date Noted  . Acute chest pain 10/02/2017  . SOB (shortness of breath) 10/02/2017  . Painful breathing 10/02/2017  . Acute suppurative parotitis 04/10/2017  . Glucose intolerance (impaired glucose tolerance) 04/03/2017  . Vitamin D deficiency 04/03/2017  . HLD (hyperlipidemia) 04/03/2017  . HTN, goal below 130/80 03/20/2017  . History of chronic back pain- MRI done in past 03/20/2017  . Peripheral edema 03/20/2017  . Snoring- terrible 03/20/2017  . Obesity, Class III, BMI 40-49.9 (morbid obesity) (HCC) 03/20/2017  . Elevated liver enzymes 03/20/2017  . Myalgia 03/20/2017  . Arthralgia 03/20/2017  . BV (bacterial vaginosis) 03/22/2012  . Urinary catheter infection (HCC) 03/22/2012    Past Medical history, Surgical history, Family history, Social history, Allergies and Medications have been entered into the medical record, reviewed and changed as needed.    Current Meds  Medication Sig  . aspirin (BAYER LOW DOSE) 81 MG EC tablet Take 81 mg by mouth daily. Swallow whole.  Marland Kitchen azelastine (ASTELIN) 0.1 % nasal spray USE 1 SPRAY IN EACH NOSTRIL 2 TIMES DAILY  . Cholecalciferol (VITAMIN D3) 5000 units TABS 5,000 IU OTC vitamin D3 daily. (Patient taking differently: Take 2 tablets by mouth daily. 5,000 IU OTC vitamin D3 daily.)  . Multiple Vitamin (MULTIVITAMIN) capsule Take 1 capsule by mouth daily.  [name] AD 4-10-325 MG TABS Take 1 tablet by mouth every 6 (six) hours as needed.  . pantoprazole (PROTONIX) 40 MG tablet Take 40 mg by mouth daily.   . Vitamin D, Ergocalciferol, (DRISDOL) 50000 units CAPS capsule Take 1 capsule (50,000 Units total) by mouth every 7 (seven)  days.  . [DISCONTINUED] chlorpheniramine-HYDROcodone (TUSSIONEX) 10-8 MG/5ML SUER Take 5 mLs by mouth every 12 (twelve) hours as needed for cough (cough, will cause drowsiness.).  . [DISCONTINUED] hydrochlorothiazide (HYDRODIURIL) 12.5 MG tablet Take 1 tablet (12.5 mg total) by mouth daily.  . [DISCONTINUED] losartan (COZAAR) 100 MG tablet Take 0.5 tablets (50 mg total) by mouth daily.    Allergies:  No Known Allergies   Review of Systems: General:   Denies fever, chills, unexplained weight loss.  Optho/Auditory:   Denies visual changes, blurred vision/LOV Respiratory:   Denies wheeze, DOE more than baseline levels.  Cardiovascular:   Denies chest pain, palpitations, new onset peripheral edema  Gastrointestinal:   Denies nausea, vomiting, diarrhea, abd pain.  Genitourinary: Denies dysuria, freq/ urgency, flank pain or discharge from genitals.  Endocrine:     Denies hot or cold intolerance, polyuria, polydipsia. Musculoskeletal:   Denies unexplained myalgias, joint swelling, unexplained arthralgias, gait problems.  Skin:  Denies new onset rash, suspicious lesions Neurological:     Denies dizziness, unexplained weakness, numbness  Psychiatric/Behavioral:   Denies mood changes, suicidal or homicidal  ideations, hallucinations    Objective:   Blood pressure 138/83, pulse 82, height 5' 2.25" (1.581 m), weight 260 lb 12.8 oz (118.3 kg), last menstrual period 09/11/2017, SpO2 99 %. Body mass index is 47.32 kg/m. General:  Well Developed, well nourished, appropriate for stated age.  Neuro:  Alert and oriented,  extra-ocular muscles intact  HEENT:  Normocephalic, atraumatic, neck supple Skin:  no gross rash, warm, pink. Cardiac:  RRR, S1 S2 Respiratory:  Breath sounds globally decreased due to body habitus, and decreased aeration due to subjective painful inspiration.  ECTA B/L and A/P, Not using accessory muscles, speaking in full sentences- unlabored. Vascular:  Ext warm, no cyanosis  apprec.; cap RF less 2 sec. Psych:  No HI/SI, judgement and insight good, Euthymic mood. Full Affect. Chest Wall: Exquisitely tender R anterior chest wall from T-3 through T6 distribution from sternum to axilla.  Breath sounds globally decreased due to body habitus, and decreased aeration due to subjective painful inspiration.

## 2017-10-03 ENCOUNTER — Other Ambulatory Visit: Payer: Self-pay | Admitting: Family Medicine

## 2017-10-03 DIAGNOSIS — R079 Chest pain, unspecified: Secondary | ICD-10-CM

## 2017-10-03 DIAGNOSIS — R0602 Shortness of breath: Secondary | ICD-10-CM

## 2017-10-03 DIAGNOSIS — R071 Chest pain on breathing: Secondary | ICD-10-CM

## 2017-10-07 ENCOUNTER — Other Ambulatory Visit: Payer: Self-pay | Admitting: Family Medicine

## 2017-10-07 DIAGNOSIS — I10 Essential (primary) hypertension: Secondary | ICD-10-CM

## 2017-10-09 ENCOUNTER — Other Ambulatory Visit: Payer: No Typology Code available for payment source

## 2017-10-16 LAB — HM PAP SMEAR: HM Pap smear: NEGATIVE

## 2018-01-24 ENCOUNTER — Other Ambulatory Visit: Payer: Self-pay

## 2018-01-24 ENCOUNTER — Telehealth: Payer: Self-pay | Admitting: Family Medicine

## 2018-01-24 NOTE — Telephone Encounter (Signed)
Patient is needing a refill. Medication last filled by a pervious provider.  Sent to Dr. Sharee Holster for review.  Patient aware. MPulliam, CMA/RT(R)

## 2018-01-24 NOTE — Telephone Encounter (Signed)
Refill request for Protonix, last filled by pervious provider.  Sent request to Dr. Sharee Holster to review.  LOV 10/02/17 for acute and 05/24/17 for chronic care.  Patient has an appointment with Dr. Sharee Holster on 03/19/2018. MPulliam, CMA/RT(R)

## 2018-01-24 NOTE — Telephone Encounter (Signed)
Patient went to the pharmacy and tried getting her Protonix and it was denied. She would like to speak with the nurse about this issue. Please advise

## 2018-01-25 MED ORDER — PANTOPRAZOLE SODIUM 40 MG PO TBEC: 40.0000 mg | DELAYED_RELEASE_TABLET | Freq: Every day | ORAL | 1 refills | Status: DC

## 2018-03-13 ENCOUNTER — Encounter: Payer: No Typology Code available for payment source | Admitting: Family Medicine

## 2018-03-19 ENCOUNTER — Encounter: Payer: Self-pay | Admitting: Family Medicine

## 2018-03-19 ENCOUNTER — Ambulatory Visit (INDEPENDENT_AMBULATORY_CARE_PROVIDER_SITE_OTHER): Payer: No Typology Code available for payment source | Admitting: Family Medicine

## 2018-03-19 VITALS — BP 126/88 | HR 86 | Ht 62.25 in | Wt 247.9 lb

## 2018-03-19 DIAGNOSIS — Z0000 Encounter for general adult medical examination without abnormal findings: Secondary | ICD-10-CM

## 2018-03-19 DIAGNOSIS — R7302 Impaired glucose tolerance (oral): Secondary | ICD-10-CM | POA: Diagnosis not present

## 2018-03-19 DIAGNOSIS — E559 Vitamin D deficiency, unspecified: Secondary | ICD-10-CM

## 2018-03-19 DIAGNOSIS — R748 Abnormal levels of other serum enzymes: Secondary | ICD-10-CM

## 2018-03-19 DIAGNOSIS — L680 Hirsutism: Secondary | ICD-10-CM

## 2018-03-19 DIAGNOSIS — E282 Polycystic ovarian syndrome: Secondary | ICD-10-CM | POA: Insufficient documentation

## 2018-03-19 DIAGNOSIS — I10 Essential (primary) hypertension: Secondary | ICD-10-CM

## 2018-03-19 DIAGNOSIS — E785 Hyperlipidemia, unspecified: Secondary | ICD-10-CM

## 2018-03-19 NOTE — Patient Instructions (Addendum)
Ask OBGYN about excess hair (hirsutism), hormonal concerns, and metabolic syndrome.  Preventive Care for Adults, Female  A healthy lifestyle and preventive care can promote health and wellness. Preventive health guidelines for women include the following key practices.   A routine yearly physical is a good way to check with your health care provider about your health and preventive screening. It is a chance to share any concerns and updates on your health and to receive a thorough exam.   Visit your dentist for a routine exam and preventive care every 6 months. Brush your teeth twice a day and floss once a day. Good oral hygiene prevents tooth decay and gum disease.   The frequency of eye exams is based on your age, health, family medical history, use of contact lenses, and other factors. Follow your health care provider's recommendations for frequency of eye exams.   Eat a healthy diet. Foods like vegetables, fruits, whole grains, low-fat dairy products, and lean protein foods contain the nutrients you need without too many calories. Decrease your intake of foods high in solid fats, added sugars, and salt. Eat the right amount of calories for you.Get information about a proper diet from your health care provider, if necessary.   Regular physical exercise is one of the most important things you can do for your health. Most adults should get at least 150 minutes of moderate-intensity exercise (any activity that increases your heart rate and causes you to sweat) each week. In addition, most adults need muscle-strengthening exercises on 2 or more days a week.   Maintain a healthy weight. The body mass index (BMI) is a screening tool to identify possible weight problems. It provides an estimate of body fat based on height and weight. Your health care provider can find your BMI, and can help you achieve or maintain a healthy weight.For adults 20 years and older:   - A BMI below 18.5 is considered  underweight.   - A BMI of 18.5 to 24.9 is normal.   - A BMI of 25 to 29.9 is considered overweight.   - A BMI of 30 and above is considered obese.   Maintain normal blood lipids and cholesterol levels by exercising and minimizing your intake of trans and saturated fats.  Eat a balanced diet with plenty of fruit and vegetables. Blood tests for lipids and cholesterol should begin at age 57 and be repeated every 5 years minimum.  If your lipid or cholesterol levels are high, you are over 40, or you are at high risk for heart disease, you may need your cholesterol levels checked more frequently.Ongoing high lipid and cholesterol levels should be treated with medicines if diet and exercise are not working.   If you smoke, find out from your health care provider how to quit. If you do not use tobacco, do not start.   Lung cancer screening is recommended for adults aged 14-80 years who are at high risk for developing lung cancer because of a history of smoking. A yearly low-dose CT scan of the lungs is recommended for people who have at least a 30-pack-year history of smoking and are a current smoker or have quit within the past 15 years. A pack year of smoking is smoking an average of 1 pack of cigarettes a day for 1 year (for example: 1 pack a day for 30 years or 2 packs a day for 15 years). Yearly screening should continue until the smoker has stopped smoking for at least  15 years. Yearly screening should be stopped for people who develop a health problem that would prevent them from having lung cancer treatment.   If you are pregnant, do not drink alcohol. If you are breastfeeding, be very cautious about drinking alcohol. If you are not pregnant and choose to drink alcohol, do not have more than 1 drink per day. One drink is considered to be 12 ounces (355 mL) of beer, 5 ounces (148 mL) of wine, or 1.5 ounces (44 mL) of liquor.   Avoid use of street drugs. Do not share needles with anyone. Ask for  help if you need support or instructions about stopping the use of drugs.   High blood pressure causes heart disease and increases the risk of stroke. Your blood pressure should be checked at least yearly.  Ongoing high blood pressure should be treated with medicines if weight loss and exercise do not work.   If you are 50-13 years old, ask your health care provider if you should take aspirin to prevent strokes.   Diabetes screening involves taking a blood sample to check your fasting blood sugar level. This should be done once every 3 years, after age 28, if you are within normal weight and without risk factors for diabetes. Testing should be considered at a younger age or be carried out more frequently if you are overweight and have at least 1 risk factor for diabetes.   Breast cancer screening is essential preventive care for women. You should practice "breast self-awareness."  This means understanding the normal appearance and feel of your breasts and may include breast self-examination.  Any changes detected, no matter how small, should be reported to a health care provider.  Women in their 25s and 30s should have a clinical breast exam (CBE) by a health care provider as part of a regular health exam every 1 to 3 years.  After age 30, women should have a CBE every year.  Starting at age 82, women should consider having a mammogram (breast X-ray test) every year.  Women who have a family history of breast cancer should talk to their health care provider about genetic screening.  Women at a high risk of breast cancer should talk to their health care providers about having an MRI and a mammogram every year.   -Breast cancer gene (BRCA)-related cancer risk assessment is recommended for women who have family members with BRCA-related cancers. BRCA-related cancers include breast, ovarian, tubal, and peritoneal cancers. Having family members with these cancers may be associated with an increased risk for  harmful changes (mutations) in the breast cancer genes BRCA1 and BRCA2. Results of the assessment will determine the need for genetic counseling and BRCA1 and BRCA2 testing.   The Pap test is a screening test for cervical cancer. A Pap test can show cell changes on the cervix that might become cervical cancer if left untreated. A Pap test is a procedure in which cells are obtained and examined from the lower end of the uterus (cervix).   - Women should have a Pap test starting at age 60.   - Between ages 77 and 28, Pap tests should be repeated every 2 years.   - Beginning at age 39, you should have a Pap test every 3 years as long as the past 3 Pap tests have been normal.   - Some women have medical problems that increase the chance of getting cervical cancer. Talk to your health care provider about these problems. It  is especially important to talk to your health care provider if a new problem develops soon after your last Pap test. In these cases, your health care provider may recommend more frequent screening and Pap tests.   - The above recommendations are the same for women who have or have not gotten the vaccine for human papillomavirus (HPV).   - If you had a hysterectomy for a problem that was not cancer or a condition that could lead to cancer, then you no longer need Pap tests. Even if you no longer need a Pap test, a regular exam is a good idea to make sure no other problems are starting.   - If you are between ages 53 and 68 years, and you have had normal Pap tests going back 10 years, you no longer need Pap tests. Even if you no longer need a Pap test, a regular exam is a good idea to make sure no other problems are starting.   - If you have had past treatment for cervical cancer or a condition that could lead to cancer, you need Pap tests and screening for cancer for at least 20 years after your treatment.   - If Pap tests have been discontinued, risk factors (such as a new sexual  partner) need to be reassessed to determine if screening should be resumed.   - The HPV test is an additional test that may be used for cervical cancer screening. The HPV test looks for the virus that can cause the cell changes on the cervix. The cells collected during the Pap test can be tested for HPV. The HPV test could be used to screen women aged 63 years and older, and should be used in women of any age who have unclear Pap test results. After the age of 54, women should have HPV testing at the same frequency as a Pap test.   Colorectal cancer can be detected and often prevented. Most routine colorectal cancer screening begins at the age of 70 years and continues through age 49 years. However, your health care provider may recommend screening at an earlier age if you have risk factors for colon cancer. On a yearly basis, your health care provider may provide home test kits to check for hidden blood in the stool.  Use of a small camera at the end of a tube, to directly examine the colon (sigmoidoscopy or colonoscopy), can detect the earliest forms of colorectal cancer. Talk to your health care provider about this at age 74, when routine screening begins. Direct exam of the colon should be repeated every 5 -10 years through age 4 years, unless early forms of pre-cancerous polyps or small growths are found.   People who are at an increased risk for hepatitis B should be screened for this virus. You are considered at high risk for hepatitis B if:  -You were born in a country where hepatitis B occurs often. Talk with your health care provider about which countries are considered high risk.  - Your parents were born in a high-risk country and you have not received a shot to protect against hepatitis B (hepatitis B vaccine).  - You have HIV or AIDS.  - You use needles to inject street drugs.  - You live with, or have sex with, someone who has Hepatitis B.  - You get hemodialysis treatment.  - You  take certain medicines for conditions like cancer, organ transplantation, and autoimmune conditions.   Hepatitis C blood testing is  recommended for all people born from [birth year] through 1965 and any individual with known risks for hepatitis C.   Practice safe sex. Use condoms and avoid high-risk sexual practices to reduce the spread of sexually transmitted infections (STIs). STIs include gonorrhea, chlamydia, syphilis, trichomonas, herpes, HPV, and human immunodeficiency virus (HIV). Herpes, HIV, and HPV are viral illnesses that have no cure. They can result in disability, cancer, and death. Sexually active women aged 47 years and younger should be checked for chlamydia. Older women with new or multiple partners should also be tested for chlamydia. Testing for other STIs is recommended if you are sexually active and at increased risk.   Osteoporosis is a disease in which the bones lose minerals and strength with aging. This can result in serious bone fractures or breaks. The risk of osteoporosis can be identified using a bone density scan. Women ages 46 years and over and women at risk for fractures or osteoporosis should discuss screening with their health care providers. Ask your health care provider whether you should take a calcium supplement or vitamin D to There are also several preventive steps women can take to avoid osteoporosis and resulting fractures or to keep osteoporosis from worsening. -->Recommendations include:  Eat a balanced diet high in fruits, vegetables, calcium, and vitamins.  Get enough calcium. The recommended total intake of is 1,200 mg daily; for best absorption, if taking supplements, divide doses into 250-500 mg doses throughout the day. Of the two types of calcium, calcium carbonate is best absorbed when taken with food but calcium citrate can be taken on an empty stomach.  Get enough vitamin D. [employer] and the [employer] recommend at least 1,000 IU per  day for women age 75 and over who are at risk of vitamin D deficiency. Vitamin D deficiency can be caused by inadequate sun exposure (for example, those who live in [place]).  Avoid alcohol and smoking. Heavy alcohol intake (more than 7 drinks per week) increases the risk of falls and hip fracture and women smokers tend to lose bone more rapidly and have lower bone mass than nonsmokers. Stopping smoking is one of the most important changes women can make to improve their health and decrease risk for disease.  Be physically active every day. Weight-bearing exercise (for example, fast walking, hiking, jogging, and weight training) may strengthen bones or slow the rate of bone loss that comes with aging. Balancing and muscle-strengthening exercises can reduce the risk of falling and fracture.  Consider therapeutic medications. Currently, several types of effective drugs are available. Healthcare providers can recommend the type most appropriate for each woman.  Eliminate environmental factors that may contribute to accidents. Falls cause nearly 90% of all osteoporotic fractures, so reducing this risk is an important bone-health strategy. Measures include ample lighting, removing obstructions to walking, using nonskid rugs on floors, and placing mats and/or grab bars in showers.  Be aware of medication side effects. Some common medicines make bones weaker. These include a type of steroid drug called glucocorticoids used for arthritis and asthma, some antiseizure drugs, certain sleeping pills, treatments for endometriosis, and some cancer drugs. An overactive thyroid gland or using too much thyroid hormone for an underactive thyroid can also be a problem. If you are taking these medicines, talk to your doctor about what you can do to help protect your bones.reduce the rate of osteoporosis.    Menopause can be associated with physical symptoms and risks. Hormone replacement therapy is available to  decrease symptoms and risks. You should talk to your health care provider about whether hormone replacement therapy is right for you.   Use sunscreen. Apply sunscreen liberally and repeatedly throughout the day. You should seek shade when your shadow is shorter than you. Protect yourself by wearing long sleeves, pants, a wide-brimmed hat, and sunglasses year round, whenever you are outdoors.   Once a month, do a whole body skin exam, using a mirror to look at the skin on your back. Tell your health care provider of new moles, moles that have irregular borders, moles that are larger than a pencil eraser, or moles that have changed in shape or color.   -Stay current with required vaccines (immunizations).   Influenza vaccine. All adults should be immunized every year.  Tetanus, diphtheria, and acellular pertussis (Td, Tdap) vaccine. Pregnant women should receive 1 dose of Tdap vaccine during each pregnancy. The dose should be obtained regardless of the length of time since the last dose. Immunization is preferred during the 27th 36th week of gestation. An adult who has not previously received Tdap or who does not know her vaccine status should receive 1 dose of Tdap. This initial dose should be followed by tetanus and diphtheria toxoids (Td) booster doses every 10 years. Adults with an unknown or incomplete history of completing a 3-dose immunization series with Td-containing vaccines should begin or complete a primary immunization series including a Tdap dose. Adults should receive a Td booster every 10 years.  Varicella vaccine. An adult without evidence of immunity to varicella should receive 2 doses or a second dose if she has previously received 1 dose. Pregnant females who do not have evidence of immunity should receive the first dose after pregnancy. This first dose should be obtained before leaving the health care facility. The second dose should be obtained 4 8 weeks after the first  dose.  Human papillomavirus (HPV) vaccine. Females aged 48 26 years who have not received the vaccine previously should obtain the 3-dose series. The vaccine is not recommended for use in pregnant females. However, pregnancy testing is not needed before receiving a dose. If a female is found to be pregnant after receiving a dose, no treatment is needed. In that case, the remaining doses should be delayed until after the pregnancy. Immunization is recommended for any person with an immunocompromised condition through the age of 84 years if she did not get any or all doses earlier. During the 3-dose series, the second dose should be obtained 4 8 weeks after the first dose. The third dose should be obtained 24 weeks after the first dose and 16 weeks after the second dose.  Zoster vaccine. One dose is recommended for adults aged 39 years or older unless certain conditions are present.  Measles, mumps, and rubella (MMR) vaccine. Adults born before [birth year] generally are considered immune to measles and mumps. Adults born in [birth year] or later should have 1 or more doses of MMR vaccine unless there is a contraindication to the vaccine or there is laboratory evidence of immunity to each of the three diseases. A routine second dose of MMR vaccine should be obtained at least 28 days after the first dose for students attending postsecondary schools, health care workers, or international travelers. People who received inactivated measles vaccine or an unknown type of measles vaccine during 1963 1967 should receive 2 doses of MMR vaccine. People who received inactivated mumps vaccine or an unknown type of mumps vaccine before 1979 and are at  high risk for mumps infection should consider immunization with 2 doses of MMR vaccine. For females of childbearing age, rubella immunity should be determined. If there is no evidence of immunity, females who are not pregnant should be vaccinated. If there is no evidence of immunity, females  who are pregnant should delay immunization until after pregnancy. Unvaccinated health care workers born before [birth year] who lack laboratory evidence of measles, mumps, or rubella immunity or laboratory confirmation of disease should consider measles and mumps immunization with 2 doses of MMR vaccine or rubella immunization with 1 dose of MMR vaccine.  Pneumococcal 13-valent conjugate (PCV13) vaccine. When indicated, a person who is uncertain of her immunization history and has no record of immunization should receive the PCV13 vaccine. An adult aged 63 years or older who has certain medical conditions and has not been previously immunized should receive 1 dose of PCV13 vaccine. This PCV13 should be followed with a dose of pneumococcal polysaccharide (PPSV23) vaccine. The PPSV23 vaccine dose should be obtained at least 8 weeks after the dose of PCV13 vaccine. An adult aged 71 years or older who has certain medical conditions and previously received 1 or more doses of PPSV23 vaccine should receive 1 dose of PCV13. The PCV13 vaccine dose should be obtained 1 or more years after the last PPSV23 vaccine dose.  Pneumococcal polysaccharide (PPSV23) vaccine. When PCV13 is also indicated, PCV13 should be obtained first. All adults aged 49 years and older should be immunized. An adult younger than age 37 years who has certain medical conditions should be immunized. Any person who resides in a nursing home or long-term care facility should be immunized. An adult smoker should be immunized. People with an immunocompromised condition and certain other conditions should receive both PCV13 and PPSV23 vaccines. People with human immunodeficiency virus (HIV) infection should be immunized as soon as possible after diagnosis. Immunization during chemotherapy or radiation therapy should be avoided. Routine use of PPSV23 vaccine is not recommended for American Indians, [place] Natives, or people younger than 65 years unless there are  medical conditions that require PPSV23 vaccine. When indicated, people who have unknown immunization and have no record of immunization should receive PPSV23 vaccine. One-time revaccination 5 years after the first dose of PPSV23 is recommended for people aged 67 64 years who have chronic kidney failure, nephrotic syndrome, asplenia, or immunocompromised conditions. People who received 1 2 doses of PPSV23 before age 61 years should receive another dose of PPSV23 vaccine at age 19 years or later if at least 5 years have passed since the previous dose. Doses of PPSV23 are not needed for people immunized with PPSV23 at or after age 18 years.  Meningococcal vaccine. Adults with asplenia or persistent complement component deficiencies should receive 2 doses of quadrivalent meningococcal conjugate (MenACWY-D) vaccine. The doses should be obtained at least 2 months apart. Microbiologists working with certain meningococcal bacteria, [place] recruits, people at risk during an outbreak, and people who travel to or live in countries with a high rate of meningitis should be immunized. A first-year college student up through age 28 years who is living in a residence hall should receive a dose if she did not receive a dose on or after her 16th birthday. Adults who have certain high-risk conditions should receive one or more doses of vaccine.  Hepatitis A vaccine. Adults who wish to be protected from this disease, have certain high-risk conditions, work with hepatitis A-infected animals, work in hepatitis A research labs, or travel to or  work in countries with a high rate of hepatitis A should be immunized. Adults who were previously unvaccinated and who anticipate close contact with an international adoptee during the first 60 days after arrival in the [place] from a country with a high rate of hepatitis A should be immunized.  Hepatitis B vaccine.  Adults who wish to be protected from this disease, have certain  high-risk conditions, may be exposed to blood or other infectious body fluids, are household contacts or sex partners of hepatitis B positive people, are clients or workers in certain care facilities, or travel to or work in countries with a high rate of hepatitis B should be immunized.  Haemophilus influenzae type b (Hib) vaccine. A previously unvaccinated person with asplenia or sickle cell disease or having a scheduled splenectomy should receive 1 dose of Hib vaccine. Regardless of previous immunization, a recipient of a hematopoietic stem cell transplant should receive a 3-dose series 6 12 months after her successful transplant. Hib vaccine is not recommended for adults with HIV infection.  Preventive Services / Frequency Ages 107 to 39years  Blood pressure check.** / Every 1 to 2 years.  Lipid and cholesterol check.** / Every 5 years beginning at age 61.  Clinical breast exam.** / Every 3 years for women in their 25s and 75s.  BRCA-related cancer risk assessment.** / For women who have family members with a BRCA-related cancer (breast, ovarian, tubal, or peritoneal cancers).  Pap test.** / Every 2 years from ages 19 through 73. Every 3 years starting at age 32 through age 20 or 7 with a history of 3 consecutive normal Pap tests.  HPV screening.** / Every 3 years from ages 67 through ages 3 to 66 with a history of 3 consecutive normal Pap tests.  Hepatitis C blood test.** / For any individual with known risks for hepatitis C.  Skin self-exam. / Monthly.  Influenza vaccine. / Every year.  Tetanus, diphtheria, and acellular pertussis (Tdap, Td) vaccine.** / Consult your health care provider. Pregnant women should receive 1 dose of Tdap vaccine during each pregnancy. 1 dose of Td every 10 years.  Varicella vaccine.** / Consult your health care provider. Pregnant females who do not have evidence of immunity should receive the first dose after pregnancy.  HPV vaccine. / 3 doses over 6  months, if 12 and younger. The vaccine is not recommended for use in pregnant females. However, pregnancy testing is not needed before receiving a dose.  Measles, mumps, rubella (MMR) vaccine.** / You need at least 1 dose of MMR if you were born in 1957 or later. You may also need a 2nd dose. For females of childbearing age, rubella immunity should be determined. If there is no evidence of immunity, females who are not pregnant should be vaccinated. If there is no evidence of immunity, females who are pregnant should delay immunization until after pregnancy.  Pneumococcal 13-valent conjugate (PCV13) vaccine.** / Consult your health care provider.  Pneumococcal polysaccharide (PPSV23) vaccine.** / 1 to 2 doses if you smoke cigarettes or if you have certain conditions.  Meningococcal vaccine.** / 1 dose if you are age 64 to 31 years and a Market researcher living in a residence hall, or have one of several medical conditions, you need to get vaccinated against meningococcal disease. You may also need additional booster doses.  Hepatitis A vaccine.** / Consult your health care provider.  Hepatitis B vaccine.** / Consult your health care provider.  Haemophilus influenzae type b (Hib)  vaccine.** / Consult your health care provider.  Ages 2 to 64years  Blood pressure check.** / Every 1 to 2 years.  Lipid and cholesterol check.** / Every 5 years beginning at age 11 years.  Lung cancer screening. / Every year if you are aged 56 80 years and have a 30-pack-year history of smoking and currently smoke or have quit within the past 15 years. Yearly screening is stopped once you have quit smoking for at least 15 years or develop a health problem that would prevent you from having lung cancer treatment.  Clinical breast exam.** / Every year after age 11 years.  BRCA-related cancer risk assessment.** / For women who have family members with a BRCA-related cancer (breast, ovarian, tubal, or  peritoneal cancers).  Mammogram.** / Every year beginning at age 40 years and continuing for as long as you are in good health. Consult with your health care provider.  Pap test.** / Every 3 years starting at age 72 years through age 43 or 43 years with a history of 3 consecutive normal Pap tests.  HPV screening.** / Every 3 years from ages 33 years through ages 9 to 109 years with a history of 3 consecutive normal Pap tests.  Fecal occult blood test (FOBT) of stool. / Every year beginning at age 90 years and continuing until age 57 years. You may not need to do this test if you get a colonoscopy every 10 years.  Flexible sigmoidoscopy or colonoscopy.** / Every 5 years for a flexible sigmoidoscopy or every 10 years for a colonoscopy beginning at age 48 years and continuing until age 36 years.  Hepatitis C blood test.** / For all people born from [birth year] through 1967 and any individual with known risks for hepatitis C.  Skin self-exam. / Monthly.  Influenza vaccine. / Every year.  Tetanus, diphtheria, and acellular pertussis (Tdap/Td) vaccine.** / Consult your health care provider. Pregnant women should receive 1 dose of Tdap vaccine during each pregnancy. 1 dose of Td every 10 years.  Varicella vaccine.** / Consult your health care provider. Pregnant females who do not have evidence of immunity should receive the first dose after pregnancy.  Zoster vaccine.** / 1 dose for adults aged 32 years or older.  Measles, mumps, rubella (MMR) vaccine.** / You need at least 1 dose of MMR if you were born in 1957 or later. You may also need a 2nd dose. For females of childbearing age, rubella immunity should be determined. If there is no evidence of immunity, females who are not pregnant should be vaccinated. If there is no evidence of immunity, females who are pregnant should delay immunization until after pregnancy.  Pneumococcal 13-valent conjugate (PCV13) vaccine.** / Consult your health care  provider.  Pneumococcal polysaccharide (PPSV23) vaccine.** / 1 to 2 doses if you smoke cigarettes or if you have certain conditions.  Meningococcal vaccine.** / Consult your health care provider.  Hepatitis A vaccine.** / Consult your health care provider.  Hepatitis B vaccine.** / Consult your health care provider.  Haemophilus influenzae type b (Hib) vaccine.** / Consult your health care provider.  Ages 58 years and over  Blood pressure check.** / Every 1 to 2 years.  Lipid and cholesterol check.** / Every 5 years beginning at age 63 years.  Lung cancer screening. / Every year if you are aged 24 80 years and have a 30-pack-year history of smoking and currently smoke or have quit within the past 15 years. Yearly screening is stopped once you have  quit smoking for at least 15 years or develop a health problem that would prevent you from having lung cancer treatment.  Clinical breast exam.** / Every year after age 22 years.  BRCA-related cancer risk assessment.** / For women who have family members with a BRCA-related cancer (breast, ovarian, tubal, or peritoneal cancers).  Mammogram.** / Every year beginning at age 78 years and continuing for as long as you are in good health. Consult with your health care provider.  Pap test.** / Every 3 years starting at age 64 years through age 39 or 45 years with 3 consecutive normal Pap tests. Testing can be stopped between 65 and 70 years with 3 consecutive normal Pap tests and no abnormal Pap or HPV tests in the past 10 years.  HPV screening.** / Every 3 years from ages 64 years through ages 39 or 34 years with a history of 3 consecutive normal Pap tests. Testing can be stopped between 65 and 70 years with 3 consecutive normal Pap tests and no abnormal Pap or HPV tests in the past 10 years.  Fecal occult blood test (FOBT) of stool. / Every year beginning at age 83 years and continuing until age 41 years. You may not need to do this test if you  get a colonoscopy every 10 years.  Flexible sigmoidoscopy or colonoscopy.** / Every 5 years for a flexible sigmoidoscopy or every 10 years for a colonoscopy beginning at age 53 years and continuing until age 48 years.  Hepatitis C blood test.** / For all people born from [birth year] through 1967 and any individual with known risks for hepatitis C.  Osteoporosis screening.** / A one-time screening for women ages 67 years and over and women at risk for fractures or osteoporosis.  Skin self-exam. / Monthly.  Influenza vaccine. / Every year.  Tetanus, diphtheria, and acellular pertussis (Tdap/Td) vaccine.** / 1 dose of Td every 10 years.  Varicella vaccine.** / Consult your health care provider.  Zoster vaccine.** / 1 dose for adults aged 57 years or older.  Pneumococcal 13-valent conjugate (PCV13) vaccine.** / Consult your health care provider.  Pneumococcal polysaccharide (PPSV23) vaccine.** / 1 dose for all adults aged 13 years and older.  Meningococcal vaccine.** / Consult your health care provider.  Hepatitis A vaccine.** / Consult your health care provider.  Hepatitis B vaccine.** / Consult your health care provider.  Haemophilus influenzae type b (Hib) vaccine.** / Consult your health care provider. ** Family history and personal history of risk and conditions may change your health care provider's recommendations. Document Released: 10/17/2001 Document Revised: 06/11/2013  [facility] Patient Information 2014 [place], [place].   EXERCISE AND DIET:  We recommended that you start or continue a regular exercise program for good health. Regular exercise means any activity that makes your heart beat faster and makes you sweat.  We recommend exercising at least 30 minutes per day at least 3 days a week, preferably 5.  We also recommend a diet low in fat and sugar / carbohydrates.  Inactivity, poor dietary choices and obesity can cause diabetes, heart attack, stroke, and kidney damage, among  others.     ALCOHOL AND SMOKING:  Women should limit their alcohol intake to no more than 7 drinks/beers/glasses of wine (combined, not each!) per week. Moderation of alcohol intake to this level decreases your risk of breast cancer and liver damage.  ( And of course, no recreational drugs are part of a healthy lifestyle.)  Also, you should not be smoking at all  or even being exposed to second hand smoke. Most people know smoking can cause cancer, and various heart and lung diseases, but did you know it also contributes to weakening of your bones?  Aging of your skin?  Yellowing of your teeth and nails?   CALCIUM AND VITAMIN D:  Adequate intake of calcium and Vitamin D are recommended.  The recommendations for exact amounts of these supplements seem to change often, but generally speaking 600 mg of calcium (either carbonate or citrate) and 800 units of Vitamin D per day seems prudent. Certain women may benefit from higher intake of Vitamin D.  If you are among these women, your doctor will have told you during your visit.     PAP SMEARS:  Pap smears, to check for cervical cancer or precancers,  have traditionally been done yearly, although recent scientific advances have shown that most women can have pap smears less often.  However, every woman still should have a physical exam from her gynecologist or primary care physician every year. It will include a breast check, inspection of the vulva and vagina to check for abnormal growths or skin changes, a visual exam of the cervix, and then an exam to evaluate the size and shape of the uterus and ovaries.  And after 39 years of age, a rectal exam is indicated to check for rectal cancers. We will also provide age appropriate advice regarding health maintenance, like when you should have certain vaccines, screening for sexually transmitted diseases, bone density testing, colonoscopy, mammograms, etc.    MAMMOGRAMS:  All women over 27 years old should have  a yearly mammogram. Many facilities now offer a "3D" mammogram, which may cost around $50 extra out of pocket. If possible,  we recommend you accept the option to have the 3D mammogram performed.  It both reduces the number of women who will be called back for extra views which then turn out to be normal, and it is better than the routine mammogram at detecting truly abnormal areas.     COLONOSCOPY:  Colonoscopy to screen for colon cancer is recommended for all women at age 80.  We know, you hate the idea of the prep.  We agree, BUT, having colon cancer and not knowing it is worse!!  Colon cancer so often starts as a polyp that can be seen and removed at colonscopy, which can quite literally save your life!  And if your first colonoscopy is normal and you have no family history of colon cancer, most women don't have to have it again for 10 years.  Once every ten years, you can do something that may end up saving your life, right?  We will be happy to help you get it scheduled when you are ready.  Be sure to check your insurance coverage so you understand how much it will cost.  It may be covered as a preventative service at no cost, but you should check your particular policy.

## 2018-03-19 NOTE — Progress Notes (Signed)
Impression and Recommendations:    1. Encounter for wellness examination   2. Hirsutism   3. Obesity, Class III, BMI 40-49.9 (morbid obesity) (HCC)   4. Glucose intolerance (impaired glucose tolerance)   5. HTN, goal below 130/80   6. Vitamin D insufficiency   7. Elevated liver enzymes   8. Hyperlipidemia, unspecified hyperlipidemia type     1) Anticipatory Guidance: Discussed importance of wearing a seatbelt while driving, not texting while driving; sunscreen when outside along with yearly skin surveillance; eating a well balanced and modest diet; physical activity at least 25 minutes per day or 150 min/ week of moderate to intense activity.  - Dermatological Screening - Advised patient to continue to follow up with dermatology, especially given her mother's diagnosis of melanoma.  Educated patient to screen her own skin, keeping an eye out for areas of skin that are changing noticeably, darkening, growing irregular edges, etc.  - Dental Screening - Encouraged the patient to begin visiting the dentist regularly.  - Dilated Eye Exams - Encouraged the patient to begin obtaining yearly dilated eye exams.  2) Immunizations / Screenings / Labs:  All immunizations and screenings that patient agrees to, are up-to-date per recommendations or will be updated today.  Patient understands the needs for q 72mo dental and yearly vision screens which pt will schedule independently. Obtain CBC, CMP, HgA1c, Lipid panel, TSH and vit D when fasting if not already done recently.   3) Blood Pressure Management: - To keep blood pressure low, continue medication and adopt lifestyle changes such as dash diet and engaging in a regular exercise program discussed with patient.  Ambulatory BP monitoring encouraged. Keep log and bring in next OV  4) Female Healthcare Management - Dysmenorrhea: - Advised patient to return to her OBGYN for follow-up. - Ask OBGYN why she is having this issue - is it PCOS,  hormonal, metabolic, etc.? - Second, inform the doctor about her symptoms and heavy bleeding from last complaint. - Emphasized to patient that OBGYN will keep up with her follow-up care.  - Referral placed to Endocrinology - Excessive body hair/Hirsutism has been an issue the patient's entire life.  5) Weight:   Discussed goal of losing even 5-10% of current body weight which would improve overall feelings of well being and improve objective health data significantly.   Improve nutrient density of diet through increasing intake of fruits and vegetables and decreasing saturated/trans fats, white flour products and refined sugar products.   - BMI Counseling Explained to patient what BMI refers to, and what it means medically.    Told patient to think about it as a "medical risk stratification measurement" and how increasing BMI is associated with increasing risk/ or worsening state of various diseases such as hypertension, hyperlipidemia, diabetes, premature OA, depression etc.  American Heart Association guidelines for healthy diet, basically Mediterranean diet, and exercise guidelines of 30 minutes 5 days per week or more discussed in detail.  Health counseling performed.  All questions answered.  - Lifestyle & Preventative Health Maintenance - Advised patient to continue working toward exercising and weight loss to improve overall mental, physical, and emotional health.    - Engage in daily physical activity, to a goal of 30 minutes per day.  Recommended that the patient eventually strive for at least 150 minutes of moderate cardiovascular activity per week according to guidelines established by the [facility].   - Healthy dietary habits encouraged, including low-carb, and high amounts of lean protein in  diet.   - Patient should also consume adequate amounts of water - half of body weight in oz of water per day.  6) Follow-Up: - Will go over her labs next visit.  Patient will bring in her blood  pressure log.  - Return for regularly scheduled chronic health maintenance visits.  - Patient knows that she may follow-up sooner if she experiences acute concerns.  No orders of the defined types were placed in this encounter.   Orders Placed This Encounter  Procedures  . CBC with Differential/Platelet  . Comprehensive metabolic panel  . VITAMIN D 25 Hydroxy (Vit-D Deficiency, Fractures)  . TSH  . Hemoglobin A1c  . Lipid panel  . T4, free  . Ambulatory referral to Endocrinology    Gross side effects, risk and benefits, and alternatives of medications discussed with patient.  Patient is aware that all medications have potential side effects and we are unable to predict every side effect or drug-drug interaction that may occur.  Expresses verbal understanding and consents to current therapy plan and treatment regimen.  F-up preventative CPE in 1 year. F/up sooner for chronic care management as discussed and/or prn.  Please see orders placed and AVS handed out to patient at the end of our visit for further patient instructions/ counseling done pertaining to today's office visit.  This document serves as a record of services personally performed by [name], DO. It was created on her behalf by [name], a trained medical scribe. The creation of this record is based on the scribe's personal observations and the provider's statements to them.   I have reviewed the above medical documentation for accuracy and completeness and I concur.  [name] 03/19/18 9:39 AM     Subjective:    Chief Complaint  Patient presents with  . Annual Exam    HPI: YAMINAH CLAYBORN is a 37 y.o. female who presents to [facility] today a yearly health maintenance exam.  Health Maintenance Summary Reviewed and updated, unless pt declines services.  Colonoscopy:  At 37 years old, patient is below screening age. Tobacco History Reviewed:    Y; never smoker Alcohol:    No concerns, no excessive use Exercise Habits:   Not meeting AHA guidelines STD concerns:   none Drug Use:   None Birth control method:   n/a Menses regular:     n/a Lumps or breast concerns:      no Breast Cancer Family History:      No  Lifestyle Habits States she's been exercising and taking her blood pressure meds.  She dances with the kids and walks.  Notes they try to work out 3 times a week; she tries to get at least 15 minutes if she can't get anything else.  When she walks, she walks about a mile and a half or two miles, which takes her about 30 minutes.  Denies issues with vision or headaches.  Denies SOB, dizziness, chest pain.    Female Health Last pap smear with Dr. Stefano Gaul, OBGYN at [facility] in 2019.  Patient states she had a female cyst removed, and her sister "had her ovaries frozen."  Mom's sister had breast cancer at age 47.  Her OBGYN keeps track of all of this.  Notes her mother went through menopause at age 69.  Patient has concerns to ask her OBGYN about dysmenorrhea.  She has very irregular periods, sometimes skipping for months at a time.  States  that her OBGYN provided "a pill to help with this."  She takes this pill only when her period doesn't arrive, and it induces her period to start.  However, last time she took this pill, she notes she bled a lot, and she's afraid of taking it again.    Patient is also concerned about her amount of body hair.  States she has always had excessive body hair and it has been a cause of significant distress for her.  Dental & Eye Health Patient hasn't been to the dentist in a while or obtained an eye exam recently.  Dermatological Health States that her mother has leukemia and melanoma.  Patient started going to dermatology once yearly, with Dr. Sharyn LullHaverstock.  She has a full body scan scheduled in three months.  GI Problems Denies colon cancer early onset in a family member.  States  she experiences nausea, vomiting, diarrhea, occasionally, probably due to things that she eats.  Says "I think it's just my body; my whole family is like that."  "I can eat the same meal three days in a row and twice it can be fine, but the third day it can kill me."  She believes this may be due to stress.   Immunization History  Administered Date(s) Administered  . Tdap 07/13/2016    Health Maintenance  Topic Date Due  . INFLUENZA VACCINE  05/08/2018 (Originally 04/04/2018)  . HIV Screening  03/20/2029 (Originally 04/21/1994)  . PAP SMEAR  05/26/2019  . TETANUS/TDAP  07/13/2026     Wt Readings from Last 3 Encounters:  03/19/18 247 lb 14.4 oz (112.4 kg)  10/02/17 260 lb 12.8 oz (118.3 kg)  09/05/17 260 lb 3.2 oz (118 kg)   BP Readings from Last 3 Encounters:  03/19/18 126/88  10/02/17 138/83  09/05/17 (!) 152/104   Pulse Readings from Last 3 Encounters:  03/19/18 86  10/02/17 82  09/05/17 93     Past Medical History:  Diagnosis Date  . Abnormal Pap smear   . Anxiety   . Broken arm    Left   . Complication of anesthesia    Epidural- baby's HR decreased ; developed  fever  . Fatigue 2008  . H/O dyspareunia 01/23/11  . H/O varicella   . Hx of candidiasis   . Hx: UTI (urinary tract infection)   . Hypertension   . Increased BMI   . Irregular periods/menstrual cycles 2008  . Postpartum depression   . Vaginal lesion 2009    Painful  . Vaginal pain 01/23/11      Past Surgical History:  Procedure Laterality Date  . repair broken arm    . TONSILLECTOMY     Age 31  . TUBAL LIGATION    . WISDOM TOOTH EXTRACTION        Family History  Problem Relation Age of Onset  . Hypertension Mother   . Diabetes Mother   . Cancer Mother        skin  . Leukemia Mother   . Diabetes Father   . Hypertension Father   . COPD Father   . Stroke Maternal Grandmother       Social History   Substance and Sexual Activity  Drug Use No  ,   Social History   Substance  and Sexual Activity  Alcohol Use Yes  . Alcohol/week: 0.6 oz  . Types: 1 Standard drinks or equivalent per week   Comment: social  ,   Social History   Tobacco  Use  Smoking Status Never Smoker  Smokeless Tobacco Never Used  ,   Social History   Substance and Sexual Activity  Sexual Activity Never  . Partners: Male   Comment: BTL    Current Outpatient Medications on File Prior to Visit  Medication Sig Dispense Refill  . aspirin (BAYER LOW DOSE) 81 MG EC tablet Take 81 mg by mouth daily. Swallow whole.    Marland Kitchen azelastine (ASTELIN) 0.1 % nasal spray USE 1 SPRAY IN EACH NOSTRIL 2 TIMES DAILY 30 mL 0  . Cholecalciferol (VITAMIN D3) 5000 units TABS 5,000 IU OTC vitamin D3 daily. (Patient taking differently: Take 2 tablets by mouth daily. 5,000 IU OTC vitamin D3 daily.) 90 tablet 3  . hydrochlorothiazide (HYDRODIURIL) 12.5 MG tablet TAKE 1 TABLET BY MOUTH EVERY DAY 90 tablet 1  . losartan (COZAAR) 100 MG tablet TAKE 0.5 TABLETS (50 MG TOTAL) BY MOUTH DAILY. 90 tablet 1  . Multiple Vitamin (MULTIVITAMIN) capsule Take 1 capsule by mouth daily.    [name] AD 4-10-325 MG TABS Take 1 tablet by mouth every 6 (six) hours as needed.  0  . pantoprazole (PROTONIX) 40 MG tablet Take 1 tablet (40 mg total) by mouth daily. 90 tablet 1  . Vitamin D, Ergocalciferol, (DRISDOL) 50000 units CAPS capsule Take 1 capsule (50,000 Units total) by mouth every 7 (seven) days. 12 capsule 10   No current facility-administered medications on file prior to visit.     Allergies: Patient has no known allergies.  Review of Systems: General:   Denies fever, chills, unexplained weight loss.  Optho/Auditory:   Denies visual changes, blurred vision/LOV Respiratory:   Denies SOB, DOE more than baseline levels.  Cardiovascular:   Denies chest pain, palpitations, new onset peripheral edema  Gastrointestinal:   Denies nausea, vomiting, diarrhea.  Genitourinary: Denies dysuria, freq/ urgency, flank pain or discharge from  genitals.  Endocrine:     Denies hot or cold intolerance, polyuria, polydipsia. Musculoskeletal:   Denies unexplained myalgias, joint swelling, unexplained arthralgias, gait problems.  Skin:  Denies rash, suspicious lesions Neurological:     Denies dizziness, unexplained weakness, numbness  Psychiatric/Behavioral:   Denies mood changes, suicidal or homicidal ideations, hallucinations    Objective:    Blood pressure 126/88, pulse 86, height 5' 2.25" (1.581 m), weight 247 lb 14.4 oz (112.4 kg), SpO2 98 %. Body mass index is 44.98 kg/m. General Appearance:    Alert, cooperative, no distress, appears stated age  Head:    Normocephalic, without obvious abnormality, atraumatic  Eyes:    PERRL, conjunctiva/corneas clear, EOM's intact, fundi    benign, both eyes  Ears:    Normal TM's and external ear canals, both ears  Nose:   Nares normal, septum midline, mucosa normal, no drainage    or sinus tenderness  Throat:   Lips w/o lesion, mucosa moist, and tongue normal; teeth and   gums normal  Neck:   Supple, symmetrical, trachea midline, no adenopathy;    thyroid:  no enlargement/tenderness/nodules; no carotid   bruit or JVD  Back:     Symmetric, no curvature, ROM normal, no CVA tenderness  Lungs:     Clear to auscultation bilaterally, respirations unlabored, no       Wh/ R/ R  Chest Wall:    No tenderness or gross deformity; normal excursion   Heart:    Regular rate and rhythm, S1 and S2 normal, no murmur, rub   or gallop  Breast Exam:    Not performed;  follows up with OBGYN.  Abdomen:     Soft, non-tender, bowel sounds active all four quadrants, NO   G/R/R, no masses, no organomegaly  Genitalia:   Not performed; follows up with OBGYN.  Rectal:   Not performed; follows up with OBGYN.  Extremities:   Extremities normal, atraumatic, no cyanosis or gross edema  Pulses:   2+ and symmetric all extremities  Skin:   Warm, dry, Skin color, texture, turgor normal, no obvious rashes or  lesions Psych: No HI/SI, judgement and insight good, Euthymic mood. Full Affect.  Neurologic:   CNII-XII intact, normal strength, sensation and reflexes    Throughout

## 2018-03-20 ENCOUNTER — Encounter: Payer: Self-pay | Admitting: Family Medicine

## 2018-03-20 LAB — CBC WITH DIFFERENTIAL/PLATELET
BASOS ABS: 0 10*3/uL (ref 0.0–0.2)
BASOS: 0 %
EOS (ABSOLUTE): 0.2 10*3/uL (ref 0.0–0.4)
Eos: 2 %
Hematocrit: 47.3 % — ABNORMAL HIGH (ref 34.0–46.6)
Hemoglobin: 15.9 g/dL (ref 11.1–15.9)
IMMATURE GRANULOCYTES: 0 %
Immature Grans (Abs): 0 10*3/uL (ref 0.0–0.1)
Lymphocytes Absolute: 3.8 10*3/uL — ABNORMAL HIGH (ref 0.7–3.1)
Lymphs: 32 %
MCH: 30.6 pg (ref 26.6–33.0)
MCHC: 33.6 g/dL (ref 31.5–35.7)
MCV: 91 fL (ref 79–97)
MONOS ABS: 0.8 10*3/uL (ref 0.1–0.9)
Monocytes: 7 %
NEUTROS PCT: 59 %
Neutrophils Absolute: 7 10*3/uL (ref 1.4–7.0)
PLATELETS: 335 10*3/uL (ref 150–450)
RBC: 5.19 x10E6/uL (ref 3.77–5.28)
RDW: 13.8 % (ref 12.3–15.4)
WBC: 11.8 10*3/uL — AB (ref 3.4–10.8)

## 2018-03-20 LAB — COMPREHENSIVE METABOLIC PANEL
A/G RATIO: 1.8 (ref 1.2–2.2)
ALT: 28 IU/L (ref 0–32)
AST: 22 IU/L (ref 0–40)
Albumin: 4.5 g/dL (ref 3.5–5.5)
Alkaline Phosphatase: 83 IU/L (ref 39–117)
BUN/Creatinine Ratio: 15 (ref 9–23)
BUN: 12 mg/dL (ref 6–20)
Bilirubin Total: 0.4 mg/dL (ref 0.0–1.2)
CALCIUM: 9.8 mg/dL (ref 8.7–10.2)
CHLORIDE: 99 mmol/L (ref 96–106)
CO2: 22 mmol/L (ref 20–29)
Creatinine, Ser: 0.8 mg/dL (ref 0.57–1.00)
GFR calc Af Amer: 108 mL/min/{1.73_m2} (ref >59)
GFR, EST NON AFRICAN AMERICAN: 94 mL/min/{1.73_m2} (ref >59)
GLUCOSE: 117 mg/dL — AB (ref 65–99)
Globulin, Total: 2.5 g/dL (ref 1.5–4.5)
POTASSIUM: 4.9 mmol/L (ref 3.5–5.2)
Sodium: 143 mmol/L (ref 134–144)
Total Protein: 7 g/dL (ref 6.0–8.5)

## 2018-03-20 LAB — TSH: TSH: 3.49 u[IU]/mL (ref 0.450–4.500)

## 2018-03-20 LAB — LIPID PANEL
Chol/HDL Ratio: 5.1 ratio — ABNORMAL HIGH (ref 0.0–4.4)
Cholesterol, Total: 247 mg/dL — ABNORMAL HIGH (ref 100–199)
HDL: 48 mg/dL (ref >39)
LDL Calculated: 171 mg/dL — ABNORMAL HIGH (ref 0–99)
TRIGLYCERIDES: 142 mg/dL (ref 0–149)
VLDL CHOLESTEROL CAL: 28 mg/dL (ref 5–40)

## 2018-03-20 LAB — HEMOGLOBIN A1C
ESTIMATED AVERAGE GLUCOSE: 131 mg/dL
HEMOGLOBIN A1C: 6.2 % — AB (ref 4.8–5.6)

## 2018-03-20 LAB — VITAMIN D 25 HYDROXY (VIT D DEFICIENCY, FRACTURES): Vit D, 25-Hydroxy: 88.5 ng/mL (ref 30.0–100.0)

## 2018-03-20 LAB — T4, FREE: Free T4: 1.49 ng/dL (ref 0.82–1.77)

## 2018-04-21 ENCOUNTER — Other Ambulatory Visit: Payer: Self-pay | Admitting: Family Medicine

## 2018-04-21 DIAGNOSIS — E559 Vitamin D deficiency, unspecified: Secondary | ICD-10-CM

## 2018-04-22 ENCOUNTER — Other Ambulatory Visit: Payer: Self-pay | Admitting: Family Medicine

## 2018-04-22 DIAGNOSIS — I10 Essential (primary) hypertension: Secondary | ICD-10-CM

## 2018-04-24 ENCOUNTER — Ambulatory Visit (INDEPENDENT_AMBULATORY_CARE_PROVIDER_SITE_OTHER): Payer: No Typology Code available for payment source | Admitting: Family Medicine

## 2018-04-24 ENCOUNTER — Encounter: Payer: Self-pay | Admitting: Family Medicine

## 2018-04-24 VITALS — BP 135/84 | HR 68 | Ht 62.0 in | Wt 247.9 lb

## 2018-04-24 DIAGNOSIS — E282 Polycystic ovarian syndrome: Secondary | ICD-10-CM

## 2018-04-24 DIAGNOSIS — D72820 Lymphocytosis (symptomatic): Secondary | ICD-10-CM

## 2018-04-24 DIAGNOSIS — R7303 Prediabetes: Secondary | ICD-10-CM | POA: Diagnosis not present

## 2018-04-24 DIAGNOSIS — E559 Vitamin D deficiency, unspecified: Secondary | ICD-10-CM

## 2018-04-24 DIAGNOSIS — Z806 Family history of leukemia: Secondary | ICD-10-CM | POA: Insufficient documentation

## 2018-04-24 DIAGNOSIS — M792 Neuralgia and neuritis, unspecified: Secondary | ICD-10-CM | POA: Insufficient documentation

## 2018-04-24 DIAGNOSIS — E66813 Obesity, class 3: Secondary | ICD-10-CM

## 2018-04-24 DIAGNOSIS — Z808 Family history of malignant neoplasm of other organs or systems: Secondary | ICD-10-CM

## 2018-04-24 DIAGNOSIS — I10 Essential (primary) hypertension: Secondary | ICD-10-CM

## 2018-04-24 DIAGNOSIS — E785 Hyperlipidemia, unspecified: Secondary | ICD-10-CM | POA: Diagnosis not present

## 2018-04-24 DIAGNOSIS — L680 Hirsutism: Secondary | ICD-10-CM

## 2018-04-24 NOTE — Patient Instructions (Addendum)
Please continue to keep an eye on your blood pressures.  Goal is less than 130/80 on a regular basis since you are so young. Also, goal is weight loss 8-10 pounds in 4 months.  At that time we can recheck A1c, CBC 2 to 3 days prior to your next office visit so please make to follow-up visits-1 lab only and then 2 to 3 days later in office visit with me to discuss and go over BP, wt loss etc Please call for appointment with Betsy Coderhristina Haverstock, MD of dermatology-your mom's dermatologist - for yearly skin screenings with your family history.   Risk factors for prediabetes and type 2 diabetes  Researchers don't fully understand why some people develop prediabetes and type 2 diabetes and others don't.  It's clear that certain factors increase the risk, however, including:  Weight. The more fatty tissue you have, the more resistant your cells become to insulin.  Inactivity. The less active you are, the greater your risk. Physical activity helps you control your weight, uses up glucose as energy and makes your cells more sensitive to insulin.  Family history. Your risk increases if a parent or sibling has type 2 diabetes.  Race. Although it's unclear why, people of certain races -- including blacks, Hispanics, American Indians and Asian-Americans -- are at higher risk.  Age. Your risk increases as you get older. This may be because you tend to exercise less, lose muscle mass and gain weight as you age. But type 2 diabetes is also increasing dramatically among children, adolescents and younger adults.  Gestational diabetes. If you developed gestational diabetes when you were pregnant, your risk of developing prediabetes and type 2 diabetes later increases. If you gave birth to a baby weighing more than 9 pounds (4 kilograms), you're also at risk of type 2 diabetes.  Polycystic ovary syndrome. For women, having polycystic ovary syndrome -- a common condition characterized by irregular menstrual periods,  excess hair growth and obesity -- increases the risk of diabetes.  High blood pressure. Having blood pressure over 140/90 millimeters of mercury (mm Hg) is linked to an increased risk of type 2 diabetes.  Abnormal cholesterol and triglyceride levels. If you have low levels of high-density lipoprotein (HDL), or "good," cholesterol, your risk of type 2 diabetes is higher. Triglycerides are another type of fat carried in the blood. People with high levels of triglycerides have an increased risk of type 2 diabetes. Your doctor can let you know what your cholesterol and triglyceride levels are.  A good guide to good carbs: The glycemic index ---If you have diabetes, or at risk for diabetes, you know all too well that when you eat carbohydrates, your blood sugar goes up. The total amount of carbs you consume at a meal or in a snack mostly determines what your blood sugar will do. But the food itself also plays a role. A serving of white rice has almost the same effect as eating pure table sugar -- a quick, high spike in blood sugar. A serving of lentils has a slower, smaller effect.  ---Picking good sources of carbs can help you control your blood sugar and your weight. Even if you don't have diabetes, eating healthier carbohydrate-rich foods can help ward off a host of chronic conditions, from heart disease to various cancers to, well, diabetes.  ---One way to choose foods is with the glycemic index (GI). This tool measures how much a food boosts blood sugar.  The glycemic index rates the effect  of a specific amount of a food on blood sugar compared with the same amount of pure glucose. A food with a glycemic index of 28 boosts blood sugar only 28% as much as pure glucose. One with a GI of 95 acts like pure glucose.    High glycemic foods result in a quick spike in insulin and blood sugar (also known as blood glucose).  Low glycemic foods have a slower, smaller effect- these are healthier for you.   Using  the glycemic index Using the glycemic index is easy: choose foods in the low GI category instead of those in the high GI category (see below), and go easy on those in between. Low glycemic index (GI of 55 or less): Most fruits and vegetables, beans, minimally processed grains, pasta, low-fat dairy foods, and nuts.  Moderate glycemic index (GI 56 to 69): White and sweet potatoes, corn, white rice, couscous, breakfast cereals such as Cream of Wheat and Mini Wheats.  High glycemic index (GI of 70 or higher): White bread, rice cakes, most crackers, bagels, cakes, doughnuts, croissants, most packaged breakfast cereals. You can see the values for 100 commons foods and get links to more at [email or web address].RecordDebt.hu.  Swaps for lowering glycemic index  Instead of this high-glycemic index food Eat this lower-glycemic index food  White rice Brown rice or converted rice  Instant oatmeal Steel-cut oats  Cornflakes Bran flakes  Baked potato Pasta, bulgur  White bread Whole-grain bread  Corn Peas or leafy greens       Prediabetes Eating Plan  Prediabetes--also called impaired glucose tolerance or impaired fasting glucose--is a condition that causes blood sugar (blood glucose) levels to be higher than normal. Following a healthy diet can help to keep prediabetes under control. It can also help to lower the risk of type 2 diabetes and heart disease, which are increased in people who have prediabetes. Along with regular exercise, a healthy diet:  Promotes weight loss.  Helps to control blood sugar levels.  Helps to improve the way that the body uses insulin.   WHAT DO I NEED TO KNOW ABOUT THIS EATING PLAN?   Use the glycemic index (GI) to plan your meals. The index tells you how quickly a food will raise your blood sugar. Choose low-GI foods. These foods take a longer time to raise blood sugar.  Pay close attention to the amount of carbohydrates in the food that you eat. Carbohydrates  increase blood sugar levels.  Keep track of how many calories you take in. Eating the right amount of calories will help you to achieve a healthy weight. Losing about 7 percent of your starting weight can help to prevent type 2 diabetes.  You may want to follow a Mediterranean diet. This diet includes a lot of vegetables, lean meats or fish, whole grains, fruits, and healthy oils and fats.   WHAT FOODS CAN I EAT?  Grains Whole grains, such as whole-wheat or whole-grain breads, crackers, cereals, and pasta. Unsweetened oatmeal. Bulgur. Barley. Quinoa. Brown rice. Corn or whole-wheat flour tortillas or taco shells. Vegetables Lettuce. Spinach. Peas. Beets. Cauliflower. Cabbage. Broccoli. Carrots. Tomatoes. Squash. Eggplant. Herbs. Peppers. Onions. Cucumbers. Brussels sprouts. Fruits Berries. Bananas. Apples. Oranges. Grapes. Papaya. Mango. Pomegranate. Kiwi. Grapefruit. Cherries. Meats and Other Protein Sources Seafood. Lean meats, such as chicken and [place] or lean cuts of pork and beef. Tofu. Eggs. Nuts. Beans. Dairy Low-fat or fat-free dairy products, such as yogurt, cottage cheese, and cheese. Beverages Water. Tea. Coffee. Sugar-free or diet soda.  Seltzer water. Milk. Milk alternatives, such as soy or almond milk. Condiments Mustard. Relish. Low-fat, low-sugar ketchup. Low-fat, low-sugar barbecue sauce. Low-fat or fat-free mayonnaise. Sweets and Desserts Sugar-free or low-fat pudding. Sugar-free or low-fat ice cream and other frozen treats. Fats and Oils Avocado. Walnuts. Olive oil. The items listed above may not be a complete list of recommended foods or beverages. Contact your dietitian for more options.    WHAT FOODS ARE NOT RECOMMENDED?  Grains Refined white flour and flour products, such as bread, pasta, snack foods, and cereals. Beverages Sweetened drinks, such as sweet iced tea and soda. Sweets and Desserts Baked goods, such as cake, cupcakes, pastries, cookies, and  cheesecake. The items listed above may not be a complete list of foods and beverages to avoid. Contact your dietitian for more information.   This information is not intended to replace advice given to you by your health care provider. Make sure you discuss any questions you have with your health care provider.   Document Released: 01/05/2015 Document Reviewed: 01/05/2015 Elsevier Interactive Patient Education 2016 [employer]    Guidelines for a Low Cholesterol, Low Saturated Fat Diet   Fats - Limit total intake of fats and oils. - Avoid butter, stick margarine, shortening, lard, palm and coconut oils. - Limit mayonnaise, salad dressings, gravies and sauces, unless they are homemade with low-fat ingredients. - Limit chocolate. - Choose low-fat and nonfat products, such as low-fat mayonnaise, low-fat or non-hydrogenated peanut butter, low-fat or fat-free salad dressings and nonfat gravy. - Use vegetable oil, such as canola or olive oil. - Look for margarine that does not contain trans fatty acids. - Use nuts in moderate amounts. - Read ingredient labels carefully to determine both amount and type of fat present in foods. Limit saturated and trans fats! - Avoid high-fat processed and convenience foods.  Meats and Meat Alternatives - Choose fish, chicken, [place] and lean meats. - Use dried beans, peas, lentils and tofu. - Limit egg yolks to three to four per week. - If you eat red meat, limit to no more than three servings per week and choose loin or round cuts. - Avoid fatty meats, such as bacon, sausage, franks, luncheon meats and ribs. - Avoid all organ meats, including liver.  Dairy - Choose nonfat or low-fat milk, yogurt and cottage cheese. - Most cheeses are high in fat. Choose cheeses made from non-fat milk, such as mozzarella and ricotta cheese. - Choose light or fat-free cream cheese and sour cream. - Avoid cream and sauces made with cream.  Fruits and Vegetables -  Eat a wide variety of fruits and vegetables. - Use lemon juice, vinegar or "mist" olive oil on vegetables. - Avoid adding sauces, fat or oil to vegetables.  Breads, Cereals and Grains - Choose whole-grain breads, cereals, pastas and rice. - Avoid high-fat snack foods, such as granola, cookies, pies, pastries, doughnuts and croissants.  Cooking Tips - Avoid deep fried foods. - Trim visible fat off meats and remove skin from poultry before cooking. - Bake, broil, boil, poach or roast poultry, fish and lean meats. - Drain and discard fat that drains out of meat as you cook it. - Add little or no fat to foods. - Use vegetable oil sprays to grease pans for cooking or baking. - Steam vegetables. - Use herbs or no-oil marinades to flavor foods.

## 2018-04-24 NOTE — Progress Notes (Signed)
Impression and Recommendations:    1. HTN, goal below 130/80   2. Hyperlipidemia, unspecified hyperlipidemia type   3. Obesity, Class III, BMI 40-49.9 (morbid obesity) (HCC)   4. Prediabetes/ elevated A1c.   5. PCOS (polycystic ovarian syndrome)   6. Hirsutism   7. Vitamin D deficiency   8. Neuralgia-  R upper ext- trapezius m spasm   9. Family history of malignant melanoma of skin- mom   10. Family history of leukemia- mom   3011. Lymphocytosis     Please continue to keep an eye on your blood pressures.  Goal is less than 130/80 on a regular basis since you are so young.   Also, goal is weight loss 8-10 pounds in 4 months.  At that time we can recheck A1c, CBC 2 to 3 days prior to your next office visit so please make two follow-up visits-    1 lab only and then 2 to 3 days later an office visit with me to discuss and go over BP, wt loss etc   Please call for appointment with Betsy Coderhristina Haverstock, MD of dermatology-your mom's dermatologist - for yearly skin screenings with your family history.   Neuralgia -Encouraged pt to ice and massage muscles in upper shoulder -Discussed importance of stretching tight muscles, maintaining correct posture during work, and using ergonomic equipment  -Discussed referral to sports therapy clinic for further evaluation if pain does not improve   PCOS/hirsutism -Discussed use of metformin to treat PCOS symptoms and improve weight loss -Pt wants to research metformin further and make a decision with next appointment in 4 months  Prediabetes -Patient declined metformin; wants to recheck A1C in 4 months and make a decision at that time -Discussed glycemic index of different foods and ways to improve diet. Handouts provided.  -Counseled patient on pathophysiology of the disease process of Pre-DM.   Stressed importance of dietary, exercise and lifestyle modifications as first line -Discussing the risks and benefits of metformin  -Encouraged  to return to clinic or call the office with any further questions or concerns.  Cholesterol -Discussed dietary changes and exercise to improve cholesterol  -Counseled patient on the negative impact of high cholesterol on cardiovascular and overall health -Current ASCVD risk is roughly 1.5% -Will recheck cholesterol in 4 months  HTN -Instructed pt to conduct ambulatory bp testing and bring log into next OV -Educated pt on ways to decrease bp such as breathing exercises, stress reduction, and cardio exercise -Discussed negative impact of HTN on kidneys, cardiovascular and overall health  Dermatology -Discussed the importance of regularly monitoring skin for changes especially due to melanoma family history  -Referred Pt to Dr. Glenard Haringristina Haverstock for further care  BMI -Patient set goal for 8-10lbs of weight loss in 4 months -Explained to patient what BMI refers to, and what it means medically. Told patient to think about it as a "medical risk stratification measurement" and how increasing BMI is associated with increasing risk/ or worsening state of various diseases such as hypertension, hyperlipidemia, diabetes, premature OA, depression etc.  -American Heart Association guidelines for healthy diet, basically Mediterranean diet, and exercise guidelines of 30 minutes 5 days per week or more discussed in detail.  -Health counseling performed.  All questions answered.  Vitamin D Deficiency -Prescribed Vitamin D supplement; see med list below  Gross side effects, risk and benefits, and alternatives of medications and treatment plan in general discussed with patient.  Patient is aware that all medications have  potential side effects and we are unable to predict every side effect or drug-drug interaction that may occur.   Patient will call with any questions prior to using medication if they have concerns.  Expresses verbal understanding and consents to current therapy and treatment regimen.  No  barriers to understanding were identified.  Red flag symptoms and signs discussed in detail.  Patient expressed understanding regarding what to do in case of emergency\urgent symptoms  Please see AVS handed out to patient at the end of our visit for further patient instructions/ counseling done pertaining to today's office visit.   Return for 5835-month follow-up for weight loss, blood pressure etc., 2-3-day prior blood work.    Note:  This note was prepared with assistance of Dragon voice recognition software. Occasional wrong-word or sound-a-like substitutions may have occurred due to the inherent limitations of voice recognition software.  This document serves as a record of services personally performed by [name], MD. It was created on her behalf by Alphonse GuildLauren Pearson, a trained medical scribe. The creation of this record is based on the scribe's personal observations and the provider's statements to them.   I have reviewed the above medical documentation for accuracy and completeness and I concur.  [name] 04/24/18 8:34 PM   --------------------------------------------------------------------------------------------------------------------------------------------------------------------------------------------------------------------------------------------    Subjective:     HPI: Vickie [name] is a 37 y.o. female [facility] for issues as discussed below.  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 and believes the desk is too high; struggles to reach the keyboard comfortably   Lab results 03/19/18 CBC -WBC 11.8, slightly elevated -Hematocrit 47.3; slightly above normal  Metabolic Panel -Glucose 117; insulin resistant -A1C is 6.2; increased from 6.0 on 03/2017  Cholesterol -LDL 171 -HDL 48 -triglycerides 142  HTN -States she has a bp cuff but believes it is inaccurate so she has not been taking her bp -Says her usual readings at home is around 120's/80s, but her most recent readings have been higher due to checking after exercise  Family History -Sister, mother and father have diabetes -Mother diagnosed with leukemia around 39 years old and has melanoma in multiple places that have had to be removed -Mother has diverticulitis  Gynecology -Pt currently sees Dr. Kirkland HunArthur Stringer for gynecology treatment -Tried to discuss possibility of PCOS with Dr. Stefano GaulStringer and "he blew me off" -Pt believes hirsutism, difficulty with weight loss, and glucose intolerance are due to PCOS because she has made positive dietary and lifestyle changes but feels she has not seen improvement    Wt Readings from Last 3 Encounters:  05/23/18 241 lb 6.4 oz (109.5 kg)  04/24/18 247 lb 14.4 oz (112.4 kg)  03/19/18 247 lb 14.4 oz (112.4 kg)   BP Readings from Last 3 Encounters:  05/23/18 116/81  04/24/18 135/84  03/19/18 126/88   Pulse Readings from Last 3 Encounters:  05/23/18 74  04/24/18 68  03/19/18 86   BMI Readings from Last 3 Encounters:  05/23/18 44.15 kg/m  04/24/18 45.34 kg/m  03/19/18 44.98 kg/m     Patient Care Team    Relationship Specialty Notifications Start End  [name], DO PCP - General Family Medicine  03/20/17      Patient Active Problem List   Diagnosis Date Noted  . Neuralgia-  R upper ext- trapezius m spasm 04/24/2018   . Family history of leukemia- mom 04/24/2018  . Family history of malignant melanoma of skin- mom 04/24/2018  . Prediabetes 04/24/2018  . PCOS (polycystic ovarian syndrome) 03/19/2018  . Hirsutism 03/19/2018  . Acute chest pain 10/02/2017  . SOB (shortness of breath) 10/02/2017  . Painful breathing 10/02/2017  . Acute suppurative parotitis 04/10/2017  . Glucose intolerance (impaired glucose tolerance) 04/03/2017  . Vitamin D deficiency 04/03/2017  . HLD (hyperlipidemia) 04/03/2017  . HTN, goal below 130/80 03/20/2017  . History of chronic back pain- MRI done in past 03/20/2017  . Peripheral edema 03/20/2017  . Snoring- terrible 03/20/2017  . Obesity, Class III, BMI 40-49.9 (morbid obesity) (HCC) 03/20/2017  . Elevated liver enzymes 03/20/2017  . Myalgia 03/20/2017  . Arthralgia 03/20/2017  . BV (bacterial vaginosis) 03/22/2012  . Urinary catheter infection (HCC) 03/22/2012    Past Medical history, Surgical history, Family history, Social history, Allergies and Medications have been entered into the medical record, reviewed and changed as needed.    Current Meds  Medication Sig  . aspirin (BAYER LOW DOSE) 81 MG EC tablet Take 81 mg by mouth daily. Swallow whole.  . Cholecalciferol (VITAMIN D3) 5000 units TABS 5,000 IU OTC vitamin D3 daily. (Patient taking differently: Take 2 tablets by mouth daily. 5,000 IU OTC vitamin D3 daily.)  . hydrochlorothiazide (HYDRODIURIL) 12.5 MG tablet TAKE 1 TABLET BY MOUTH EVERY DAY  . losartan (COZAAR) 100 MG tablet TAKE 0.5 TABLETS (50 MG TOTAL) BY MOUTH DAILY.  . Multiple Vitamin (MULTIVITAMIN) capsule Take 1 capsule by mouth daily.  [name] AD 4-10-325 MG TABS Take 1 tablet by mouth every 6 (six) hours as needed.  . pantoprazole (PROTONIX) 40 MG tablet Take 1 tablet (40 mg total) by mouth daily.  . Vitamin D, Ergocalciferol, (DRISDOL) 50000 units CAPS capsule TAKE 1 CAPSULE (50,000 UNITS TOTAL) BY MOUTH EVERY 7 (SEVEN) DAYS.  . [DISCONTINUED]  azelastine (ASTELIN) 0.1 % nasal spray USE 1 SPRAY IN EACH NOSTRIL 2 TIMES DAILY    Allergies:  No Known Allergies   Review of Systems:  A fourteen system review of systems was performed and found to be positive as per HPI.   Objective:   Blood pressure 135/84, pulse 68, height 5\' 2"  (1.575 m), weight 247 lb 14.4 oz (112.4 kg), SpO2 100 %. Body mass index is 45.34 kg/m. General:  Well Developed, well nourished, appropriate for stated age.  Neuro:  Alert and oriented,  extra-ocular muscles intact  HEENT:  Normocephalic, atraumatic, neck supple, no carotid bruits appreciated  Skin:  no gross rash, warm, pink. Cardiac:  RRR, S1 S2 Respiratory:  ECTA B/L and A/P, Not using accessory muscles, speaking in full sentences- unlabored. Vascular:  Ext warm, no cyanosis apprec.; cap RF less 2 sec.  Psych:  No HI/SI, judgement and insight good, Euthymic mood. Full Affect. Shoulder: Pressing down on muscles of right upper trapezius reproduces symptoms of pain and tingling

## 2018-05-16 ENCOUNTER — Ambulatory Visit: Payer: No Typology Code available for payment source | Admitting: Adult Health

## 2018-05-16 NOTE — Progress Notes (Deleted)
Subjective:    Patient ID: Vickie [name], female    DOB: 08-01-1981, 37 y.o.   MRN: [id number]  HPI:  [name] presents with She has not been on ABX in last 90 days  Patient Care Team    Relationship Specialty Notifications Start End  [name], DO PCP - General Family Medicine  03/20/17     Patient Active Problem List   Diagnosis Date Noted  . Neuralgia-  R upper ext- trapezius m spasm 04/24/2018  . Family history of leukemia- mom 04/24/2018  . Family history of malignant melanoma of skin- mom 04/24/2018  . Prediabetes 04/24/2018  . PCOS (polycystic ovarian syndrome) 03/19/2018  . Hirsutism 03/19/2018  . Acute chest pain 10/02/2017  . SOB (shortness of breath) 10/02/2017  . Painful breathing 10/02/2017  . Acute suppurative parotitis 04/10/2017  . Glucose intolerance (impaired glucose tolerance) 04/03/2017  . Vitamin D deficiency 04/03/2017  . HLD (hyperlipidemia) 04/03/2017  . HTN, goal below 130/80 03/20/2017  . History of chronic back pain- MRI done in past 03/20/2017  . Peripheral edema 03/20/2017  . Snoring- terrible 03/20/2017  . Obesity, Class III, BMI 40-49.9 (morbid obesity) (HCC) 03/20/2017  . Elevated liver enzymes 03/20/2017  . Myalgia 03/20/2017  . Arthralgia 03/20/2017  . BV (bacterial vaginosis) 03/22/2012  . Urinary catheter infection (HCC) 03/22/2012     Past Medical History:  Diagnosis Date  . Abnormal Pap smear   . Anxiety   . Broken arm    Left   . Complication of anesthesia    Epidural- baby's HR decreased ; developed  fever  . Fatigue 2008  . H/O dyspareunia 01/23/11  . H/O varicella   . Hx of candidiasis   . Hx: UTI (urinary tract infection)   . Hypertension   . Increased BMI   . Irregular periods/menstrual cycles 2008  . Postpartum depression   . Vaginal lesion 2009    Painful  . Vaginal pain 01/23/11     Past Surgical History:  Procedure Laterality Date  . repair broken arm    . TONSILLECTOMY      Age 25  . TUBAL LIGATION    . WISDOM TOOTH EXTRACTION       Family History  Problem Relation Age of Onset  . Hypertension Mother   . Diabetes Mother   . Cancer Mother        skin  . Leukemia Mother   . Diabetes Father   . Hypertension Father   . COPD Father   . Stroke Maternal Grandmother      Social History   Substance and Sexual Activity  Drug Use No     Social History   Substance and Sexual Activity  Alcohol Use Yes  . Alcohol/week: 1.0 standard drinks  . Types: 1 Standard drinks or equivalent per week   Comment: social     Social History   Tobacco Use  Smoking Status Never Smoker  Smokeless Tobacco Never Used     Outpatient Encounter Medications as of 05/16/2018  Medication Sig  . aspirin (BAYER LOW DOSE) 81 MG EC tablet Take 81 mg by mouth daily. Swallow whole.  Marland Kitchen azelastine (ASTELIN) 0.1 % nasal spray USE 1 SPRAY IN EACH NOSTRIL 2 TIMES DAILY  . Cholecalciferol (VITAMIN D3) 5000 units TABS 5,000 IU OTC vitamin D3 daily. (Patient taking differently: Take 2 tablets by mouth daily. 5,000 IU OTC vitamin D3 daily.)  . hydrochlorothiazide (HYDRODIURIL) 12.5 MG tablet TAKE 1 TABLET BY MOUTH EVERY  DAY  . losartan (COZAAR) 100 MG tablet TAKE 0.5 TABLETS (50 MG TOTAL) BY MOUTH DAILY.  . Multiple Vitamin (MULTIVITAMIN) capsule Take 1 capsule by mouth daily.  Vickie [name]. NOREL AD 4-10-325 MG TABS Take 1 tablet by mouth every 6 (six) hours as needed.  . pantoprazole (PROTONIX) 40 MG tablet Take 1 tablet (40 mg total) by mouth daily.  . Vitamin D, Ergocalciferol, (DRISDOL) 50000 units CAPS capsule TAKE 1 CAPSULE (50,000 UNITS TOTAL) BY MOUTH EVERY 7 (SEVEN) DAYS.   No facility-administered encounter medications on file as of 05/16/2018.     Allergies: Patient has no known allergies.  There is no height or weight on file to calculate BMI.  There were no vitals taken for this visit.     Review of Systems     Objective:   Physical Exam        Assessment &  Plan:  No diagnosis found.  No problem-specific Assessment & Plan notes found for this encounter.    FOLLOW-UP:  No follow-ups on file.

## 2018-05-23 ENCOUNTER — Encounter: Payer: Self-pay | Admitting: Family Medicine

## 2018-05-23 ENCOUNTER — Ambulatory Visit: Payer: No Typology Code available for payment source | Admitting: Family Medicine

## 2018-05-23 VITALS — BP 116/81 | HR 74 | Temp 98.5°F | Ht 62.0 in | Wt 241.4 lb

## 2018-05-23 DIAGNOSIS — R0602 Shortness of breath: Secondary | ICD-10-CM | POA: Diagnosis not present

## 2018-05-23 DIAGNOSIS — J019 Acute sinusitis, unspecified: Secondary | ICD-10-CM | POA: Diagnosis not present

## 2018-05-23 DIAGNOSIS — R053 Chronic cough: Secondary | ICD-10-CM

## 2018-05-23 DIAGNOSIS — R05 Cough: Secondary | ICD-10-CM | POA: Diagnosis not present

## 2018-05-23 DIAGNOSIS — R519 Headache, unspecified: Secondary | ICD-10-CM

## 2018-05-23 DIAGNOSIS — R51 Headache: Secondary | ICD-10-CM

## 2018-05-23 DIAGNOSIS — R062 Wheezing: Secondary | ICD-10-CM | POA: Diagnosis not present

## 2018-05-23 MED ORDER — PREDNISONE 20 MG PO TABS: ORAL_TABLET | ORAL | 0 refills | Status: DC

## 2018-05-23 MED ORDER — AMOXICILLIN-POT CLAVULANATE 875-125 MG PO TABS: 1.0000 | ORAL_TABLET | Freq: Two times a day (BID) | ORAL | 0 refills | Status: AC

## 2018-05-23 MED ORDER — HYDROCOD POLST-CPM POLST ER 10-8 MG/5ML PO SUER: 5.0000 mL | Freq: Two times a day (BID) | ORAL | 0 refills | Status: DC | PRN

## 2018-05-23 NOTE — Progress Notes (Signed)
Acute Care Office visit  Assessment and plan:  1. Acute non-recurrent sinusitis, unspecified location   2. Persistent dry cough   3. Wheezing   4. Shortness of breath   5. Acute nonintractable headache, unspecified headache type     - Viral vs Allergic vs Bacterial causes for pt's symptoms reveiwed.    - Supportive care and various OTC medications discussed in addition to any prescribed. - Call or RTC if new symptoms, or if no improvement or worse over next several days.   - Will consider ABX if sx continue past 10 days and worsening if not already given.    Meds ordered this encounter  Medications  . amoxicillin-clavulanate (AUGMENTIN) 875-125 MG tablet    Sig: Take 1 tablet by mouth 2 (two) times daily for 10 days.    Dispense:  20 tablet    Refill:  0  . chlorpheniramine-HYDROcodone (TUSSIONEX) 10-8 MG/5ML SUER    Sig: Take 5 mLs by mouth every 12 (twelve) hours as needed for cough (cough, will cause drowsiness.).    Dispense:  200 mL    Refill:  0  . predniSONE (DELTASONE) 20 MG tablet    Sig: Take 3 pills a day for 2 days, 2 pills a day for 2 days, 1 pill a day for 2 days then one half pill a day for 2 days then off    Dispense:  14 tablet    Refill:  0    Medications Discontinued During This Encounter  Medication Reason  . azelastine (ASTELIN) 0.1 % nasal spray    Gross side effects, risk and benefits, and alternatives of medications discussed with patient.  Patient is aware that all medications have potential side effects and we are unable to predict every sideeffect or drug-drug interaction that may occur.  Expresses verbal understanding and consents to current therapy plan and treatment regiment.   Education and routine counseling performed. Handouts provided.  Anticipatory guidance and routine counseling done re: condition, txmnt options and need for follow up. All questions of patient's were answered.  Return if symptoms worsen or fail to improve, for Also  follow-up for your chronic care.  Please see AVS handed out to patient at the end of our visit for additional patient instructions/ counseling done pertaining to today's office visit.  Note:  This document was partially repared using Dragon voice recognition software and may include unintentional dictation errors.   Subjective:    Chief Complaint  Patient presents with  . Sinus Problem    HPI:     Sx started 10 days ago.  ST, then into sinus b/l, tried elderberry syrup, and tried mucinex as well. Then moved in to her chest about 4-5 d ago.   Now dry cough- all day but W at nite- can't sleep even sleeping elevated with 2 pillows.   And now her head and ears are also hurting a lot and they are popping.  Patient started feeling wheezy 3 to 4 days ago as well.  She is been using her inhaler 3 times daily with minimal relief.    - Last antibiotic usage for anything like this or any antibiotics at all has been about 1 year.  -Patient did call her tele-doc at her work on yesterday morning early and he prescribed her albuterol inhaler as well as LawyerTessalon Perles but which did not work at all for the cough.  Overall getting:   Worse-since now she is in her chest and she feels  she cannot breathe.   Patient Care Team    Relationship Specialty Notifications Start End  [name], DO PCP - General Family Medicine  03/20/17     Past medical history, Surgical history, Family history reviewed and noted below, Social history, Allergies, and Medications have been entered into the medical record, reviewed and changed as needed.   No Known Allergies  Review of Systems: - see above HPI for pertinent positives General:   No F/C, wt loss Pulm:   No DIB, pleuritic chest pain Card:  No CP, palpitations Abd:  No n/v/d or pain Ext:  No inc edema from baseline   Objective:   Blood pressure 116/81, pulse 74, temperature 98.5 F (36.9 C), height 5\' 2"  (1.575 m), weight 241 lb 6.4 oz (109.5 kg),  SpO2 98 %. Body mass index is 44.15 kg/m. General: Well Developed, well nourished, appropriate for stated age.  Neuro: Alert and oriented x3, extra-ocular muscles intact, sensation grossly intact.  HEENT: Normocephalic, atraumatic, pupils equal round reactive to light, neck supple, no masses, no painful lymphadenopathy, TM's intact B/L, no acute findings. Nares- patent, clear d/c, OP- clear, mild erythema, No TTP sinuses Skin: Warm and dry, no gross rash. Cardiac: RRR, S1 S2,  no murmurs rubs or gallops.  Respiratory: ECTA B/L and A/P- only mild exp wh at bases- with forced exhale only, Not using accessory muscles, speaking in full sentences- unlabored. Vascular:  No gross lower ext edema, cap RF less 2 sec. Psych: No HI/SI, judgement and insight good, Euthymic mood. Full Affect.

## 2018-05-23 NOTE — Patient Instructions (Addendum)
Please Dondra SpryGail write a note for out of work tomorrow due to illness.      Symptoms for a upper respiratory tract infection usually last 3-7 days but can stretch out to 2-3 weeks before you're feeling back to normal.  Your symptoms should not worsen after 7-10 days and if they do, please notify our office, as you may need additional evaluation.  You can use over-the-counter afrin nasal spray for up to 3 days (NO longer than that) which will help acutely with nasal drainage/ congestion short term.   Also, sterile saline nasal rinses, such as Lloyd HugerNeil med or AYR sinus rinses, can be very helpful and should be done twice daily- especially throughout the allergy season.   Remember you should use distilled water or previously boiled water to do this.  Then you may use over-the-counter Flonase 1 spray each nostril twice daily after sinus rinses.   You can also use an over the counter cold and flu medication such as Tylenol Severe Cold and Sinus/Flu or Dayquil, Nyquil and the like, which will help with cough, congestion, headache/ pain, fevers/chills etc.  Please note, if you being treated for hypertension or have high blood pressure, you should be using the cold meds designated "HBP".    Wash your hands frequently, as you did not want to get those around you sick as well. Never sneeze or cough on others.  And you should not be going to school or work if you are running a temperature of 100.5 or more on two separate occasions.   Drink plenty of fluids and stay hydrated, especially if you are running fevers.  We don't know why, but chicken soup also helps, try it! :)

## 2018-07-24 ENCOUNTER — Other Ambulatory Visit: Payer: Self-pay | Admitting: Family Medicine

## 2018-07-26 ENCOUNTER — Encounter: Payer: Self-pay | Admitting: Family Medicine

## 2018-07-29 ENCOUNTER — Telehealth: Payer: Self-pay | Admitting: Family Medicine

## 2018-07-29 NOTE — Telephone Encounter (Signed)
Patient called today says sent this message via Mychart on Friday, 11/22 :  " My right breast is hurting like a sharp pain. Even if my seat belt hits it. Also, i have pressure in my bladder   I had what i think is a period about a week ago. It was not normal. "  --advised office clsd at 12 noon on Fridays & to allow 24-48 hrs for response-- offered to still send note to medical assistant to review w/provider for call back / instructions .  --glh

## 2018-07-29 NOTE — Telephone Encounter (Signed)
MyChart message responded to.  T. Nelson, CMA 

## 2018-07-31 ENCOUNTER — Encounter: Payer: Self-pay | Admitting: Family Medicine

## 2018-07-31 ENCOUNTER — Telehealth: Payer: Self-pay | Admitting: Physician Assistant

## 2018-07-31 ENCOUNTER — Telehealth: Payer: Self-pay

## 2018-07-31 ENCOUNTER — Ambulatory Visit: Payer: No Typology Code available for payment source | Admitting: Family Medicine

## 2018-07-31 VITALS — BP 119/83 | HR 79 | Temp 98.5°F | Ht 62.0 in | Wt 238.0 lb

## 2018-07-31 DIAGNOSIS — N926 Irregular menstruation, unspecified: Secondary | ICD-10-CM | POA: Insufficient documentation

## 2018-07-31 DIAGNOSIS — R350 Frequency of micturition: Secondary | ICD-10-CM | POA: Diagnosis not present

## 2018-07-31 DIAGNOSIS — N3289 Other specified disorders of bladder: Secondary | ICD-10-CM | POA: Diagnosis not present

## 2018-07-31 DIAGNOSIS — N644 Mastodynia: Secondary | ICD-10-CM | POA: Diagnosis not present

## 2018-07-31 LAB — POCT URINALYSIS DIPSTICK
BILIRUBIN UA: NEGATIVE
Glucose, UA: NEGATIVE
Ketones, UA: NEGATIVE
LEUKOCYTES UA: NEGATIVE
Nitrite, UA: NEGATIVE
Protein, UA: NEGATIVE
Urobilinogen, UA: 1 E.U./dL
pH, UA: 5.5 (ref 5.0–8.0)

## 2018-07-31 LAB — POCT URINE PREGNANCY: PREG TEST UR: NEGATIVE

## 2018-07-31 MED ORDER — OXYBUTYNIN CHLORIDE 5 MG PO TABS: 5.0000 mg | ORAL_TABLET | Freq: Three times a day (TID) | ORAL | 0 refills | Status: DC

## 2018-07-31 MED ORDER — TOLTERODINE TARTRATE ER 2 MG PO CP24: 2.0000 mg | ORAL_CAPSULE | Freq: Every day | ORAL | 3 refills | Status: DC

## 2018-07-31 NOTE — Telephone Encounter (Signed)
Patient called and states that the Detrol LA written today  is not covered by insurance and is too expensive.  Can medication be changed to something else.  Please review and advise. MPulliam, CMA/RT(R)

## 2018-07-31 NOTE — Telephone Encounter (Signed)
Yes, please inform patient in order for us to know what is covered she will need to call her insurance and then call us back with which bladder antispasmodic medicines /or overactive bladder medications are covered.

## 2018-07-31 NOTE — Patient Instructions (Signed)
Overactive Bladder, Adult Overactive bladder is a group of urinary symptoms. With overactive bladder, you may suddenly feel the need to pass urine (urinate) right away. After feeling this sudden urge, you might also leak urine if you cannot get to the bathroom fast enough (urinary incontinence). These symptoms might interfere with your daily work or social activities. Overactive bladder symptoms may also wake you up at night. Overactive bladder affects the nerve signals between your bladder and your brain. Your bladder may get the signal to empty before it is full. Very sensitive muscles can also make your bladder squeeze too soon. What are the causes? Many things can cause an overactive bladder. Possible causes include:  Urinary tract infection.  Infection of nearby tissues, such as the prostate.  Prostate enlargement.  Being pregnant with twins or more (multiples).  Surgery on the uterus or urethra.  Bladder stones, inflammation, or tumors.  Drinking too much caffeine or alcohol.  Certain medicines, especially those that you take to help your body get rid of extra fluid (diuretics) by increasing urine production.  Muscle or nerve weakness, especially from: ? A spinal cord injury. ? Stroke. ? Multiple sclerosis. ? Parkinson disease.  Diabetes. This can cause a high urine volume that fills the bladder so quickly that the normal urge to urinate is triggered very strongly.  Constipation. A buildup of too much stool can put pressure on your bladder.  What increases the risk? You may be at greater risk for overactive bladder if you:  Are an older adult.  Smoke.  Are going through menopause.  Have prostate problems.  Have a neurological disease, such as stroke, dementia, Parkinson disease, or multiple sclerosis (MS).  Eat or drink things that irritate the bladder. These include alcohol, spicy food, and caffeine.  Are overweight or obese.  What are the signs or  symptoms? The signs and symptoms of an overactive bladder include:  Sudden, strong urges to urinate.  Leaking urine.  Urinating eight or more times per day.  Waking up to urinate two or more times per night.  How is this diagnosed? Your health care provider may suspect overactive bladder based on your symptoms. The health care provider will do a physical exam and take your medical history. Blood or urine tests may also be done. For example, you might need to have a bladder function test to check how well you can hold your urine. You might also need to see a health care provider who specializes in the urinary tract (urologist). How is this treated? Treatment for overactive bladder depends on the cause of your condition and whether it is mild or severe. Certain treatments can be done in your health care provider's office or clinic. You can also make lifestyle changes at home. Options include: Behavioral Treatments  Biofeedback. A specialist uses sensors to help you become aware of your body's signals.  Keeping a daily log of when you need to urinate and what happens after the urge. This may help you manage your condition.  Bladder training. This helps you learn to control the urge to urinate by following a schedule that directs you to urinate at regular intervals (timed voiding). At first, you might have to wait a few minutes after feeling the urge. In time, you should be able to schedule bathroom visits an hour or more apart.  Kegel exercises. These are exercises to strengthen the pelvic floor muscles, which support the bladder. Toning these muscles can help you control urination, even if your  bladder muscles are overactive. A specialist will teach you how to do these exercises correctly. They require daily practice.  Weight loss. If you are obese or overweight, losing weight might relieve your symptoms of overactive bladder. Talk to your health care provider about losing weight and whether  there is a specific program or method that would work best for you.  Diet change. This might help if constipation is making your overactive bladder worse. Your health care provider or a dietitian can explain ways to change what you eat to ease constipation. You might also need to consume less alcohol and caffeine or drink other fluids at different times of the day.  Stopping smoking.  Wearing pads to absorb leakage while you wait for other treatments to take effect. Physical Treatments  Electrical stimulation. Electrodes send gentle pulses of electricity to strengthen the nerves or muscles that help to control the bladder. Sometimes, the electrodes are placed outside of the body. In other cases, they might be placed inside the body (implanted). This treatment can take several months to have an effect.  Supportive devices. Women may need a plastic device that fits into the vagina and supports the bladder (pessary). Medicines Several medicines can help treat overactive bladder and are usually used along with other treatments. Some are injected into the muscles involved in urination. Others come in pill form. Your health care provider may prescribe:  Antispasmodics. These medicines block the signals that the nerves send to the bladder. This keeps the bladder from releasing urine at the wrong time.  Tricyclic antidepressants. These types of antidepressants also relax bladder muscles.  Surgery  You may have a device implanted to help manage the nerve signals that indicate when you need to urinate.  You may have surgery to implant electrodes for electrical stimulation.  Sometimes, very severe cases of overactive bladder require surgery to change the shape of the bladder. Follow these instructions at home:  Take medicines only as directed by your health care provider.  Use any implants or a pessary as directed by your health care provider.  Make any diet or lifestyle changes that are  recommended by your health care provider. These might include: ? Drinking less fluid or drinking at different times of the day. If you need to urinate often during the night, you may need to stop drinking fluids early in the evening. ? Cutting down on caffeine or alcohol. Both can make an overactive bladder worse. Caffeine is found in coffee, tea, and sodas. ? Doing Kegel exercises to strengthen muscles. ? Losing weight if you need to. ? Eating a healthy and balanced diet to prevent constipation.  Keep a journal or log to track how much and when you drink and also when you feel the need to urinate. This will help your health care provider to monitor your condition. Contact a health care provider if:  Your symptoms do not get better after treatment.  Your pain and discomfort are getting worse.  You have more frequent urges to urinate.  You have a fever. Get help right away if: You are not able to control your bladder at all. This information is not intended to replace advice given to you by your health care provider. Make sure you discuss any questions you have with your health care provider. Document Released: 06/17/2009 Document Revised: 01/27/2016 Document Reviewed: 01/14/2014 Elsevier Interactive Patient Education  2018 [employer].    Urinary Frequency, Adult Urinary frequency means urinating more often than usual. People with  urinary frequency urinate at least 8 times in 24 hours, even if they drink a normal amount of fluid. Although they urinate more often than normal, the total amount of urine produced in a day may be normal. Urinary frequency is also called pollakiuria. What are the causes? This condition may be caused by:  A urinary tract infection.  Obesity.  Bladder problems, such as bladder stones.  Caffeine or alcohol.  Eating food or drinking fluids that irritate the bladder. These include coffee, tea, soda, artificial sweeteners, citrus, tomato-based foods, and  chocolate.  Certain medicines, such as medicines that help the body get rid of extra fluid (diuretics).  Muscle or nerve weakness.  Overactive bladder.  Chronic diabetes.  Interstitial cystitis.  In men, problems with the prostate, such as an enlarged prostate.  In women, pregnancy.  In some cases, the cause may not be known. What increases the risk? This condition is more likely to develop in:  Women who have gone through menopause.  Men with prostate problems.  People with a disease or injury that affects the nerves or spinal cord.  People who have or have had a condition that affects the brain, such as a stroke.  What are the signs or symptoms? Symptoms of this condition include:  Feeling an urgent need to urinate often. The stress and anxiety of needing to find a bathroom quickly can make this urge worse.  Urinating 8 or more times in 24 hours.  Urinating as often as every 1 to 2 hours.  How is this diagnosed? This condition is diagnosed based on your symptoms, your medical history, and a physical exam. You may have tests, such as:  Blood tests.  Urine tests.  Imaging tests, such as X-rays or ultrasounds.  A bladder test.  A test of your neurological system. This is the body system that senses the need to urinate.  A test to check for problems in the urethra and bladder called cystoscopy.  You may also be asked to keep a bladder diary. A bladder diary is a record of what you eat and drink, how often you urinate, and how much you urinate. You may need to see a health care provider who specializes in conditions of the urinary tract (urologist) or kidneys (nephrologist). How is this treated? Treatment for this condition depends on the cause. Sometimes the condition goes away on its own and treatment is not necessary. If treatment is needed, it may include:  Taking medicine.  Learning exercises that strengthen the muscles that help control  urination.  Following a bladder training program. This may include: ? Learning to delay going to the bathroom. ? Double urinating (voiding). This helps if you are not completely emptying your bladder. ? Scheduled voiding.  Making diet changes, such as: ? Avoiding caffeine. ? Drinking fewer fluids, especially alcohol. ? Not drinking in the evening. ? Not having foods or drinks that may irritate the bladder. ? Eating foods that help prevent or ease constipation. Constipation can make this condition worse.  Having the nerves in your bladder stimulated. There are two options for stimulating the nerves to your bladder: ? Outpatient electrical nerve stimulation. This is done by your health care provider. ? Surgery to implant a bladder pacemaker. The pacemaker helps to control the urge to urinate.  Follow these instructions at home:  Keep a bladder diary if told to by your health care provider.  Take over-the-counter and prescription medicines only as told by your health care provider.  Do any exercises as told by your health care provider.  Follow a bladder training program as told by your health care provider.  Make any recommended diet changes.  Keep all follow-up visits as told by your health care provider. This is important. Contact a health care provider if:  You start urinating more often.  You feel pain or irritation when you urinate.  You notice blood in your urine.  Your urine looks cloudy.  You develop a fever.  You begin vomiting. Get help right away if:  You are unable to urinate. This information is not intended to replace advice given to you by your health care provider. Make sure you discuss any questions you have with your health care provider. Document Released: 06/17/2009 Document Revised: 09/22/2015 Document Reviewed: 03/17/2015 Elsevier Interactive Patient Education  [employer]2018 [employer]

## 2018-07-31 NOTE — Progress Notes (Signed)
Impression and Recommendations:    1. Urinary frequency   2. Bladder spasms   3. Pain of right breast   4. chronic Irregular menstrual cycle-  has GYN     1. Urinary Frequency - Bladder Spasms - Patient extensively educated about bladder concerns in office today.  - Urinalysis and urine pregnancy drawn today.  Urinalysis negative.  - If the UA and culture are negative, for evaluation of her bladder spasms and large amounts of urine post-void, we will send patient to urology for further assessment, especially with family history of mom and sister having urinary problems and bladder tacks at young ages.  - Detrol LA prescribed today.  See med list today.  2. Breast Pain - Right Breast - Patient extensively educated regarding breast exam in office today.  - If patient would like to wait a couple of weeks to see if her breast pain worsens or improves, advised patient to wait for one menstrual cycle.  Per pt, her menstrual cycles are irregular.  - If pain continues, per patient calling in and letting us know, ultrasound will be ordered.  - Follow up as-needed.  3. OBGYN Follow-Up - Chronic Irregular Menstrual Cycle - Advised patient to return to OBGYN to follow-up for irregular periods and tender breasts, as there may be hormonal changes.   Education and routine counseling performed. Handouts provided.  4. Follow-Up - Prescriptions refilled today PRN. - Re-check fasting lab work as recommended. - Otherwise, continue to return for CPE and chronic follow-up as scheduled.   - Patient knows to call in sooner if desired to address acute concerns.   Orders Placed This Encounter  Procedures  . Urine Culture  . POCT urinalysis dipstick  . POCT urine pregnancy    Medications Discontinued During This Encounter  Medication Reason  . chlorpheniramine-HYDROcodone (TUSSIONEX) 10-8 MG/5ML SUER Completed Course  . guaiFENesin (MUCINEX) 600 MG 12 hr tablet No longer needed  (for PRN medications)  . NOREL AD 4-10-325 MG TABS No longer needed (for PRN medications)  . predniSONE (DELTASONE) 20 MG tablet Completed Course  . ELDERBERRY PO Patient Preference     Meds ordered this encounter  Medications  . tolterodine (DETROL LA) 2 MG 24 hr capsule    Sig: Take 1-2 capsules (2-4 mg total) by mouth daily. For bladder spasms    Dispense:  90 capsule    Refill:  3    The patient was counseled, risk factors were discussed, anticipatory guidance given.  Gross side effects, risk and benefits, and alternatives of medications discussed with patient.  Patient is aware that all medications have potential side effects and we are unable to predict every side effect or drug-drug interaction that may occur.  Expresses verbal understanding and consents to current therapy plan and treatment regimen.   Return if symptoms worsen or fail to improve, for chronic care- f/up of bladder spasms, BP in 3 mo.  This document serves as a record of services personally performed by [name], DO. It was created on her behalf by [name], a trained medical scribe. The creation of this record is based on the scribe's personal observations and the provider's statements to them.   I have reviewed the above medical documentation for accuracy and completeness and I concur.  [name], DO 07/31/2018 1:21 PM        Subjective:    HPI: Vickie [name] is a 37 y.o. female who [facility] for c/o urinary frequency and breast pain.  CC: Urinary Frequency  Sx for a week.     C/O:  "Feels like I'm having contractions in my bladder."  Experiencing pressure.  Says "I'll go pee and by the time I walk from my bathroom to my office, I feel pressure and have to pee again."  When she urinates, "it is a lot."  Notes "most of the time, 90% of the time, it is a good amount of urine."  Has been going on since last Wednesday,  one week.  Notes she also had a weird "abnormal" period.  Notes she wiped and "there was brown," "nothing got on my pad the whole time I had my period."  Next day, a "hint of bright red," then went back to the brown, nothing on the pad.  Patient states she's never had a regular period.  States she "bled like a slaughtered pig" when she took the pills to induce her period.  Patient has taken AZO in the past to relieve symptoms of urinary tract infection.  States "normally when I have an infection, I hurt like a numbness down there, and when I pee, it's a very tiny amount and it burns."  Denies:  Denies back pain, fever, chills.  Mom went through the change early, at about age 635, and then started back again at age 39.  Sister hasn't gone through the change yet and she's 50.  CC: Breast Pain Pain for 2-3 days.  Started Monday; states "I hit it Reinaldo Raddle[with my hand] at my desk."  States it "hurt so bad."  When she got in the car and put her seatbelt on, states the pain was so bad she couldn't wear the belt over her breast.  The pain has decreased since onset.  Denies discharge, redness, or changes in the superficial surface of the skin.  Patient's period ended on Friday, 3 days prior to onset of breast pain.  Denies feeling abnormality there.  States she has been doing breast exams.   Urinalysis    Component Value Date/Time   COLORURINE YELLOW 04/05/2007 0804   APPEARANCEUR CLEAR 04/05/2007 0804   LABSPEC >1.030 (H) 04/05/2007 0804   PHURINE 4.5 03/22/2012 1007   GLUCOSEU NEGATIVE 04/05/2007 0804   HGBUR TRACE (A) 04/05/2007 0804   BILIRUBINUR negative 07/31/2018 1000   KETONESUR NEGATIVE 04/05/2007 0804   PROTEINUR Negative 07/31/2018 1000   PROTEINUR NEGATIVE 04/05/2007 0804   UROBILINOGEN 1.0 07/31/2018 1000   UROBILINOGEN 1.0 04/05/2007 0804   NITRITE negative 07/31/2018 1000   NITRITE NEGATIVE 04/05/2007 0804   LEUKOCYTESUR Negative 07/31/2018 1000    Wt Readings from Last 3  Encounters:  07/31/18 238 lb (108 kg)  05/23/18 241 lb 6.4 oz (109.5 kg)  04/24/18 247 lb 14.4 oz (112.4 kg)   BP Readings from Last 3 Encounters:  07/31/18 119/83  05/23/18 116/81  04/24/18 135/84   Pulse Readings from Last 3 Encounters:  07/31/18 79  05/23/18 74  04/24/18 68   BMI Readings from Last 3 Encounters:  07/31/18 43.53 kg/m  05/23/18 44.15 kg/m  04/24/18 45.34 kg/m     Patient Active Problem List   Diagnosis Date Noted  . Bladder spasms 07/31/2018  . chronic Irregular menstrual cycle-  has GYN 07/31/2018  . Neuralgia-  R upper ext- trapezius m spasm 04/24/2018  . Family history of leukemia- mom 04/24/2018  . Family history of malignant melanoma of skin- mom 04/24/2018  .  Prediabetes 04/24/2018  . PCOS (polycystic ovarian syndrome) 03/19/2018  . Hirsutism 03/19/2018  . Acute chest pain 10/02/2017  . SOB (shortness of breath) 10/02/2017  . Painful breathing 10/02/2017  . Acute suppurative parotitis 04/10/2017  . Glucose intolerance (impaired glucose tolerance) 04/03/2017  . Vitamin D deficiency 04/03/2017  . HLD (hyperlipidemia) 04/03/2017  . HTN, goal below 130/80 03/20/2017  . History of chronic back pain- MRI done in past 03/20/2017  . Peripheral edema 03/20/2017  . Snoring- terrible 03/20/2017  . Obesity, Class III, BMI 40-49.9 (morbid obesity) (HCC) 03/20/2017  . Elevated liver enzymes 03/20/2017  . Myalgia 03/20/2017  . Arthralgia 03/20/2017  . BV (bacterial vaginosis) 03/22/2012  . Urinary catheter infection (HCC) 03/22/2012    Past Surgical History:  Procedure Laterality Date  . repair broken arm    . TONSILLECTOMY     Age 8  . TUBAL LIGATION    . WISDOM TOOTH EXTRACTION      Family History  Problem Relation Age of Onset  . Hypertension Mother   . Diabetes Mother   . Cancer Mother        skin  . Leukemia Mother   . Diabetes Father   . Hypertension Father   . COPD Father   . Stroke Maternal Grandmother     Social History    Substance and Sexual Activity  Drug Use No  ,  Social History   Substance and Sexual Activity  Alcohol Use Yes  . Alcohol/week: 1.0 standard drinks  . Types: 1 Standard drinks or equivalent per week   Comment: social  ,  Social History   Tobacco Use  Smoking Status Never Smoker  Smokeless Tobacco Never Used  ,  Social History   Substance and Sexual Activity  Sexual Activity Never  . Partners: Male   Comment: BTL    Patient's Medications  New Prescriptions   TOLTERODINE (DETROL LA) 2 MG 24 HR CAPSULE    Take 1-2 capsules (2-4 mg total) by mouth daily. For bladder spasms  Previous Medications   ALBUTEROL (PROVENTIL HFA;VENTOLIN HFA) 108 (90 BASE) MCG/ACT INHALER    Inhale 2 puffs into the lungs every 6 (six) hours as needed for wheezing or shortness of breath.   ASPIRIN (BAYER LOW DOSE) 81 MG EC TABLET    Take 81 mg by mouth daily. Swallow whole.   CHOLECALCIFEROL (VITAMIN D3) 5000 UNITS TABS    5,000 IU OTC vitamin D3 daily.   HYDROCHLOROTHIAZIDE (HYDRODIURIL) 12.5 MG TABLET    TAKE 1 TABLET BY MOUTH EVERY DAY   LOSARTAN (COZAAR) 100 MG TABLET    TAKE 0.5 TABLETS (50 MG TOTAL) BY MOUTH DAILY.   MULTIPLE VITAMIN (MULTIVITAMIN) CAPSULE    Take 1 capsule by mouth daily.   PANTOPRAZOLE (PROTONIX) 40 MG TABLET    TAKE 1 TABLET BY MOUTH EVERY DAY   VITAMIN D, ERGOCALCIFEROL, (DRISDOL) 50000 UNITS CAPS CAPSULE    TAKE 1 CAPSULE (50,000 UNITS TOTAL) BY MOUTH EVERY 7 (SEVEN) DAYS.  Modified Medications   No medications on file  Discontinued Medications   CHLORPHENIRAMINE-HYDROCODONE (TUSSIONEX) 10-8 MG/5ML SUER    Take 5 mLs by mouth every 12 (twelve) hours as needed for cough (cough, will cause drowsiness.).   ELDERBERRY PO    Take by mouth.   GUAIFENESIN (MUCINEX) 600 MG 12 HR TABLET    Take 1,200 mg by mouth 2 (two) times daily.   NOREL AD 4-10-325 MG TABS    Take 1 tablet by mouth every 6 (  six) hours as needed.   PREDNISONE (DELTASONE) 20 MG TABLET    Take 3 pills a day for  2 days, 2 pills a day for 2 days, 1 pill a day for 2 days then one half pill a day for 2 days then off    Patient has no known allergies.  Current Meds  Medication Sig  . albuterol (PROVENTIL HFA;VENTOLIN HFA) 108 (90 Base) MCG/ACT inhaler Inhale 2 puffs into the lungs every 6 (six) hours as needed for wheezing or shortness of breath.  Marland Kitchen aspirin (BAYER LOW DOSE) 81 MG EC tablet Take 81 mg by mouth daily. Swallow whole.  . Cholecalciferol (VITAMIN D3) 5000 units TABS 5,000 IU OTC vitamin D3 daily. (Patient taking differently: Take 2 tablets by mouth daily. 5,000 IU OTC vitamin D3 daily.)  . hydrochlorothiazide (HYDRODIURIL) 12.5 MG tablet TAKE 1 TABLET BY MOUTH EVERY DAY  . losartan (COZAAR) 100 MG tablet TAKE 0.5 TABLETS (50 MG TOTAL) BY MOUTH DAILY.  . Multiple Vitamin (MULTIVITAMIN) capsule Take 1 capsule by mouth daily.  . pantoprazole (PROTONIX) 40 MG tablet TAKE 1 TABLET BY MOUTH EVERY DAY  . Vitamin D, Ergocalciferol, (DRISDOL) 50000 units CAPS capsule TAKE 1 CAPSULE (50,000 UNITS TOTAL) BY MOUTH EVERY 7 (SEVEN) DAYS.    Review of Systems: General:   No F/C, wt loss Pulm:   No DIB, pleuritic chest pain Card:  No CP, palpitations Abd:  No n/v/d or pain GU:  Dysuria, increased frequency and urgency; no vaginal discharge Ext:  No inc edema from baseline   Objective:  Blood pressure 119/83, pulse 79, temperature 98.5 F (36.9 C), height 5\' 2"  (1.575 m), weight 238 lb (108 kg), SpO2 98 %. Body mass index is 43.53 kg/m.  General: Well Developed, well nourished, and in no acute distress.  HEENT: Normocephalic, atraumatic Skin: Warm and dry, cap RF less 2 sec, good turgor CV: +S1, S2 Respiratory: ECTA B/L; speaking in full sentences, no conversational dyspnea Abd: Soft, NT, ND, No G/R/R, no SPT, No flank pain NeuroM-Sk: Ambulates w/o assistance, moves * 4 Psych: A and O *3 Breast: In right breast, normal breast tissue appreciated around 3 o'clock position with mild bogginess and  tenderness to palpation.  Tenderness also around 8 o'clock position, with normal breast tissue appreciated, but tenderness to deep palpation.  Tenderness worse in 3 o'clock position.  No masses, or nipple abnormality b/l; no d/c

## 2018-07-31 NOTE — Telephone Encounter (Signed)
Called patient and notified. MPulliam, CMA/RT(R)  

## 2018-07-31 NOTE — Telephone Encounter (Signed)
Patient called [facility] requesting alternative medication for Detrol LA to be sent to her pharmacy since insurance would not cover Detrol. Oxybutynin sent

## 2018-08-02 LAB — URINE CULTURE

## 2018-08-06 ENCOUNTER — Encounter: Payer: Self-pay | Admitting: Family Medicine

## 2018-08-09 ENCOUNTER — Other Ambulatory Visit: Payer: Self-pay

## 2018-08-09 DIAGNOSIS — N3289 Other specified disorders of bladder: Secondary | ICD-10-CM

## 2018-08-09 DIAGNOSIS — R339 Retention of urine, unspecified: Secondary | ICD-10-CM

## 2018-08-20 ENCOUNTER — Other Ambulatory Visit: Payer: No Typology Code available for payment source

## 2018-08-22 ENCOUNTER — Ambulatory Visit: Payer: No Typology Code available for payment source | Admitting: Family Medicine

## 2018-08-23 ENCOUNTER — Other Ambulatory Visit: Payer: Self-pay | Admitting: Physician Assistant

## 2018-09-02 ENCOUNTER — Telehealth: Payer: Self-pay

## 2018-09-02 NOTE — Telephone Encounter (Signed)
Please call pt and advise her that refills for oxybutynin need to be authorized by her urologist.  Thanks!  Tiajuana Amass. Leelyn Jasinski, CMA

## 2018-09-09 ENCOUNTER — Other Ambulatory Visit: Payer: Self-pay | Admitting: Physician Assistant

## 2018-09-10 ENCOUNTER — Other Ambulatory Visit: Payer: Self-pay | Admitting: Family Medicine

## 2018-09-10 NOTE — Telephone Encounter (Signed)
Request was sent to our office.  Please review and advise.  Thanks  MPulliam, CMA/RT(R)

## 2018-09-11 NOTE — Telephone Encounter (Signed)
Patient contacted me when I was on call requesting a cheaper alternative to for Detrol LA.  If Dr. Val Eagle wishes to continue this medication she should be the one to refill it

## 2018-09-11 NOTE — Telephone Encounter (Signed)
Already addressed through another note that was sent to me 

## 2018-09-11 NOTE — Telephone Encounter (Signed)
Already addressed through another note that was sent to me

## 2018-09-11 NOTE — Telephone Encounter (Signed)
Already addressed through another note that was sent to me-   Approved meds

## 2018-09-11 NOTE — Telephone Encounter (Signed)
LOV 07/31/2018. Detrol LA was changed by on call provider to oxybutynin due to cost. Please review and advise if refills is appropriate. MPulliam, CMA/RT(R)

## 2018-10-08 ENCOUNTER — Other Ambulatory Visit: Payer: Self-pay | Admitting: Family Medicine

## 2018-10-15 ENCOUNTER — Ambulatory Visit (INDEPENDENT_AMBULATORY_CARE_PROVIDER_SITE_OTHER): Payer: No Typology Code available for payment source | Admitting: Family Medicine

## 2018-10-15 ENCOUNTER — Encounter: Payer: Self-pay | Admitting: Family Medicine

## 2018-10-15 VITALS — BP 111/79 | HR 80 | Temp 98.2°F | Ht 62.0 in | Wt 234.5 lb

## 2018-10-15 DIAGNOSIS — R7303 Prediabetes: Secondary | ICD-10-CM

## 2018-10-15 DIAGNOSIS — M791 Myalgia, unspecified site: Secondary | ICD-10-CM

## 2018-10-15 DIAGNOSIS — E785 Hyperlipidemia, unspecified: Secondary | ICD-10-CM

## 2018-10-15 DIAGNOSIS — Z0000 Encounter for general adult medical examination without abnormal findings: Secondary | ICD-10-CM | POA: Diagnosis not present

## 2018-10-15 DIAGNOSIS — R7302 Impaired glucose tolerance (oral): Secondary | ICD-10-CM

## 2018-10-15 DIAGNOSIS — Z719 Counseling, unspecified: Secondary | ICD-10-CM

## 2018-10-15 DIAGNOSIS — E559 Vitamin D deficiency, unspecified: Secondary | ICD-10-CM

## 2018-10-15 DIAGNOSIS — E282 Polycystic ovarian syndrome: Secondary | ICD-10-CM

## 2018-10-15 DIAGNOSIS — K760 Fatty (change of) liver, not elsewhere classified: Secondary | ICD-10-CM

## 2018-10-15 DIAGNOSIS — E66813 Obesity, class 3: Secondary | ICD-10-CM

## 2018-10-15 DIAGNOSIS — E781 Pure hyperglyceridemia: Secondary | ICD-10-CM

## 2018-10-15 DIAGNOSIS — I10 Essential (primary) hypertension: Secondary | ICD-10-CM

## 2018-10-15 DIAGNOSIS — R748 Abnormal levels of other serum enzymes: Secondary | ICD-10-CM

## 2018-10-15 MED ORDER — METFORMIN HCL 500 MG PO TABS: 500.0000 mg | ORAL_TABLET | Freq: Two times a day (BID) | ORAL | 0 refills | Status: DC

## 2018-10-15 NOTE — Progress Notes (Signed)
Impression and Recommendations:    1. HTN, goal below 130/80   2. Encounter for wellness examination   3. Health education/counseling   4. Glucose intolerance (impaired glucose tolerance)   5. Elevated liver enzymes   6. Nonalcoholic hepatosteatosis   7. Vitamin D deficiency   8. Myalgia   9. Hyperlipidemia, unspecified hyperlipidemia type   10. Prediabetes   11. PCOS (polycystic ovarian syndrome)   12. Obesity, Class III, BMI 40-49.9 (morbid obesity) (HCC)   13. h/o Low serum HDL   14. h/o Hypertriglyceridemia     - Patient is fasting today.  Fasting lab work drawn today.  1) Anticipatory Guidance: Discussed importance of wearing a seatbelt while driving, not texting while driving; sunscreen when outside along with yearly skin surveillance; eating a well balanced and modest diet; physical activity at least 25 minutes per day or 150 min/ week of moderate to intense activity.  2) Immunizations / Screenings / Labs:  All immunizations and screenings that patient agrees to, are up-to-date per recommendations or will be updated today.  Patient understands the needs for q 62mo dental and yearly vision screens which pt will schedule independently. Obtain CBC, CMP, HgA1c, Lipid panel, TSH and vit D when fasting if not already done recently.   - Last pap on record was from 2017; patient knows to have all current records sent to clinic here.  Per patient, all of her pap smears have been negative.  - TDAP up to date.  - Flu vaccine declined today.  Per patient, never gets the flu shot.  - Referral placed to endocrinology today to confirm PCOS and rule out other metabolic syndromes.  Metabolic syndrome -Patient was diagnosed PCOS many years ago by her GYN.  Has never been on metformin.  Will start today.  Discussed with her to take half a tablet to start twice daily and slowly increase as tolerated to 1 full tablet twice daily.  We will see back in 2 to 3 months.  -Also, due to  patient's moon face ease, buffalo hump, her chew is them etc., she has never been evaluated for other metabolic syndromes such as Cushing's etc.  I have put in a referral to endocrinology for further evaluation.   Since she is doing so well with her weight watchers and weight loss, I want to make sure were not missing other endocrinopathies that could be stunting her success.  -Fasting insulin obtained today in addition to A1c, fasting lipid profile.  Etc.  3) Weight:   Discussed goal of losing even 5-10% of current body weight which would improve overall feelings of well being and improve objective health data significantly.   Improve nutrient density of diet through increasing intake of fruits and vegetables and decreasing saturated/trans fats, white flour products and refined sugar products.   - Discussed weight loss goals at length with patient today.  Encouraged patient to continue to follow her weight loss goals using Weight Watchers.   - Patient knows that she may come in any time for further assistance with weight loss.  - Discussed self-confidence and mindfulness meditation.  - Reviewed the importance of navigating patient's emotional connection to food.  - Encouraged patient to practice self-love and put herself first sometimes.  Advised patient to remind herself daily that she is worthy of accomplishing her goals, and just as important as anyone else.  4) BMI Counseling - BMI of 42.89 Explained to patient what BMI refers to, and what it means medically.  Told patient to think about it as a "medical risk stratification measurement" and how increasing BMI is associated with increasing risk/ or worsening state of various diseases such as hypertension, hyperlipidemia, diabetes, premature OA, depression etc.  American Heart Association guidelines for healthy diet, basically Mediterranean diet, and exercise guidelines of 30 minutes 5 days per week or more discussed in detail.  Health  counseling performed.  All questions answered.  5) Lifestyle & Preventative Health Maintenance - Advised patient to continue working toward exercising to improve overall mental, physical, and emotional health.    - Reviewed the "spokes of the wheel" of mood and health management.  Stressed the importance of ongoing prudent habits, including regular exercise, appropriate sleep hygiene, healthful dietary habits, and prayer/meditation to relax.  - Encouraged patient to engage in daily physical activity, especially a formal exercise routine.  Recommended that the patient eventually strive for at least 150 minutes of moderate cardiovascular activity per week according to guidelines established by the [facility].   - Healthy dietary habits encouraged, including low-carb, and high amounts of lean protein in diet.   - Patient should also consume adequate amounts of water.   Meds ordered this encounter  Medications  . metFORMIN (GLUCOPHAGE) 500 MG tablet    Sig: Take 1 tablet (500 mg total) by mouth 2 (two) times daily with a meal.    Dispense:  180 tablet    Refill:  0    Orders Placed This Encounter  Procedures  . Comprehensive metabolic panel  . CBC with Differential/Platelet  . Hemoglobin A1c  . Lipid panel  . T4, free  . TSH  . VITAMIN D 25 Hydroxy (Vit-D Deficiency, Fractures)  . Insulin, Free and Total  . Ambulatory referral to Endocrinology  . Ambulatory referral to Endocrinology    Gross side effects, risk and benefits, and alternatives of medications discussed with patient.  Patient is aware that all medications have potential side effects and we are unable to predict every side effect or drug-drug interaction that may occur.  Expresses verbal understanding and consents to current therapy plan and treatment regimen.  F-up preventative CPE in 1 year. F/up sooner for chronic care management as discussed and/or prn.  Please see orders placed and AVS handed out to patient at the end of  our visit for further patient instructions/ counseling done pertaining to today's office visit.  This document serves as a record of services personally performed by Vickie Loteborah Tully Mcinturff, DO. It was created on her behalf by Peggye FothergillKatherine Galloway, a trained medical scribe. The creation of this record is based on the scribe's personal observations and the provider's statements to them.   I have reviewed the above medical documentation for accuracy and completeness and I concur.  Vickie Loteborah Joslynne Klatt, DO 10/15/2018 9:18 AM        Subjective:    Chief Complaint  Patient presents with  . Annual Exam     HPI: Vickie [name] is a 38 y.o. female [facility] health maintenance exam.  Health Maintenance Summary Reviewed and updated, unless pt declines services.  No first-degree relative swith colon cancer.  Tobacco History Reviewed:  Y; never smoker. Alcohol:  No concerns, no excessive use. Exercise Habits:  Not meeting AHA guidelines. STD concerns:  None. Drug Use:  None. Birth control method:  No concerns. Menses regular:  Patient with PCOS.  Chronic irregular menstrual cycle; sees GYN for this, Dr. Stefano GaulStringer. Lumps or breast  concerns:  No. Breast Cancer Family History:  Patient's mother's sister had breast cancer in her early 53's.  No first-degree relatives.  Emotional Health Feeling well.  Denies family history of depression.  "Not that they say."  Gynecological Health Just went to gynecologist on January 12th, 2020.  Is not sure if she had a pap smear done.  Believes she last had a pap smear done on record around 10/16/2017.  Patient states she has always had consecutive negative pap smears.  Feels that her PCOS is "okay."  Patient is not managed on metformin.  Visual Health Patient does not have an eye doctor.  Dental Health  Patient does not have a dentist.  Using Weight Watchers for Weight Loss Overall  feeling good.  Still doing Weight Watchers.  Took a break during the holidays, but has resumed now in January 2020.  Patient is on the [employer].  Patient is trying to change her relationship with food.  She is tracking her intake every day, and notes that she is following her Weight Watchers program closely.  Exercise Habits Every time she goes to the bathroom, she does ten squats, ten toe touches, and other exercises.  Sleep Habits Patient notes she gets about 5-7 hours of sleep normally.  GI and Genitourinary Denies nausea, vomiting, GI distress.  Denies urinary symptoms.  Notes she's been peeing more often lately, but this is because she is drinking much more water.  She states she's consuming about 160+ ounces of water per day.   Immunization History  Administered Date(s) Administered  . Tdap 07/13/2016    Health Maintenance  Topic Date Due  . INFLUENZA VACCINE  07/11/2019 (Originally 04/04/2018)  . HIV Screening  03/20/2029 (Originally 04/21/1994)  . PAP SMEAR-Modifier  05/26/2019  . TETANUS/TDAP  07/13/2026     Wt Readings from Last 3 Encounters:  10/15/18 234 lb 8 oz (106.4 kg)  07/31/18 238 lb (108 kg)  05/23/18 241 lb 6.4 oz (109.5 kg)   BP Readings from Last 3 Encounters:  10/15/18 111/79  07/31/18 119/83  05/23/18 116/81   Pulse Readings from Last 3 Encounters:  10/15/18 80  07/31/18 79  05/23/18 74     Past Medical History:  Diagnosis Date  . Abnormal Pap smear   . Anxiety   . Broken arm    Left   . Complication of anesthesia    Epidural- baby's HR decreased ; developed  fever  . Fatigue 2008  . H/O dyspareunia 01/23/11  . H/O varicella   . Hx of candidiasis   . Hx: UTI (urinary tract infection)   . Hypertension   . Increased BMI   . Irregular periods/menstrual cycles 2008  . Postpartum depression   . Vaginal lesion 2009    Painful  . Vaginal pain 01/23/11      Past Surgical History:  Procedure Laterality Date  . repair broken arm      . TONSILLECTOMY     Age 5  . TUBAL LIGATION    . WISDOM TOOTH EXTRACTION        Family History  Problem Relation Age of Onset  . Hypertension Mother   . Diabetes Mother   . Cancer Mother        skin  . Leukemia Mother   . Diabetes Father   . Hypertension Father   . COPD Father   . Stroke Maternal Grandmother       Social History   Substance and Sexual Activity  Drug  Use No  ,   Social History   Substance and Sexual Activity  Alcohol Use Yes  . Alcohol/week: 1.0 standard drinks  . Types: 1 Standard drinks or equivalent per week   Comment: social  ,   Social History   Tobacco Use  Smoking Status Never Smoker  Smokeless Tobacco Never Used  ,   Social History   Substance and Sexual Activity  Sexual Activity Never  . Partners: Male   Comment: BTL    Current Outpatient Medications on File Prior to Visit  Medication Sig Dispense Refill  . albuterol (PROVENTIL HFA;VENTOLIN HFA) 108 (90 Base) MCG/ACT inhaler Inhale 2 puffs into the lungs every 6 (six) hours as needed for wheezing or shortness of breath.    Marland Kitchen. aspirin (BAYER LOW DOSE) 81 MG EC tablet Take 81 mg by mouth daily. Swallow whole.    . Cholecalciferol (VITAMIN D3) 5000 units TABS 5,000 IU OTC vitamin D3 daily. (Patient taking differently: Take 2 tablets by mouth daily. 5,000 IU OTC vitamin D3 daily.) 90 tablet 3  . hydrochlorothiazide (HYDRODIURIL) 12.5 MG tablet TAKE 1 TABLET BY MOUTH EVERY DAY 30 tablet 5  . losartan (COZAAR) 100 MG tablet TAKE 0.5 TABLETS (50 MG TOTAL) BY MOUTH DAILY. 90 tablet 1  . Multiple Vitamin (MULTIVITAMIN) capsule Take 1 capsule by mouth daily.    Marland Kitchen. oxybutynin (DITROPAN) 5 MG tablet TAKE 1 TABLET BY MOUTH THREE TIMES A DAY 90 tablet 0  . pantoprazole (PROTONIX) 40 MG tablet TAKE 1 TABLET BY MOUTH EVERY DAY 30 tablet 5  . Vitamin D, Ergocalciferol, (DRISDOL) 50000 units CAPS capsule TAKE 1 CAPSULE (50,000 UNITS TOTAL) BY MOUTH EVERY 7 (SEVEN) DAYS. 4 capsule 32   No current  facility-administered medications on file prior to visit.     Allergies: Patient has no known allergies.  Review of Systems: General:   Denies fever, chills, unexplained weight loss.  Optho/Auditory:   Denies visual changes, blurred vision/LOV Respiratory:   Denies SOB, DOE more than baseline levels.  Cardiovascular:   Denies chest pain, palpitations, new onset peripheral edema  Gastrointestinal:   Denies nausea, vomiting, diarrhea.  Genitourinary: Denies dysuria, freq/ urgency, flank pain or discharge from genitals.  Endocrine:     Denies hot or cold intolerance, polyuria, polydipsia. Musculoskeletal:   Denies unexplained myalgias, joint swelling, unexplained arthralgias, gait problems.  Skin:  Denies rash, suspicious lesions Neurological:     Denies dizziness, unexplained weakness, numbness  Psychiatric/Behavioral:   Denies mood changes, suicidal or homicidal ideations, hallucinations    Objective:    Blood pressure 111/79, pulse 80, temperature 98.2 F (36.8 C), height 5\' 2"  (1.575 m), weight 234 lb 8 oz (106.4 kg), last menstrual period 09/18/2018, SpO2 98 %. Body mass index is 42.89 kg/m. General Appearance:    Alert, cooperative, no distress, appears stated age  Head:    Normocephalic, without obvious abnormality, atraumatic  Eyes:    PERRL, conjunctiva/corneas clear, EOM's intact, fundi    benign, both eyes  Ears:    Normal TM's and external ear canals, both ears  Nose:   Nares normal, septum midline, mucosa normal, no drainage    or sinus tenderness  Throat:   Lips w/o lesion, mucosa moist, and tongue normal; teeth and   gums normal  Neck:   Supple, symmetrical, trachea midline, no adenopathy;    thyroid:  no enlargement/tenderness/nodules; no carotid   bruit or JVD  Back:     Symmetric, no curvature, ROM normal, no CVA tenderness  Lungs:     Clear to auscultation bilaterally, respirations unlabored, no       Wh/ R/ R  Chest Wall:    No tenderness or gross deformity;  normal excursion   Heart:    Regular rate and rhythm, S1 and S2 normal, no murmur, rub   or gallop  Breast Exam:   Deferred to OBGYN.  Abdomen:     Soft, non-tender, bowel sounds active all four quadrants, NO   G/R/R, no masses, no organomegaly  Genitalia:   Deferred to OBGYN.  Rectal:   Deferred to OBGYN.  Extremities:   Extremities normal, atraumatic, no cyanosis or gross edema  Pulses:   2+ and symmetric all extremities  Skin:   Warm, dry, Skin color, texture, turgor normal, no obvious rashes or lesions Psych: No HI/SI, judgement and insight good, Euthymic mood. Full Affect.  Neurologic:   CNII-XII intact, normal strength, sensation and reflexes    Throughout

## 2018-10-15 NOTE — Patient Instructions (Addendum)
Please look into [facility].  Preventive Care for Adults, Female  A healthy lifestyle and preventive care can promote health and wellness. Preventive health guidelines for women include the following key practices.   A routine yearly physical is a good way to check with your health care provider about your health and preventive screening. It is a chance to share any concerns and updates on your health and to receive a thorough exam.   Visit your dentist for a routine exam and preventive care every 6 months. Brush your teeth twice a day and floss once a day. Good oral hygiene prevents tooth decay and gum disease.   The frequency of eye exams is based on your age, health, family medical history, use of contact lenses, and other factors. Follow your health care provider's recommendations for frequency of eye exams.   Eat a healthy diet. Foods like vegetables, fruits, whole grains, low-fat dairy products, and lean protein foods contain the nutrients you need without too many calories. Decrease your intake of foods high in solid fats, added sugars, and salt. Eat the right amount of calories for you.Get information about a proper diet from your health care provider, if necessary.   Regular physical exercise is one of the most important things you can do for your health. Most adults should get at least 150 minutes of moderate-intensity exercise (any activity that increases your heart rate and causes you to sweat) each week. In addition, most adults need muscle-strengthening exercises on 2 or more days a week.   Maintain a healthy weight. The body mass index (BMI) is a screening tool to identify possible weight problems. It provides an estimate of body fat based on height and weight. Your health care provider can find your BMI, and can help you achieve or maintain a healthy weight.For adults 20 years and older:   - A BMI below 18.5 is considered underweight.   - A BMI of 18.5 to  24.9 is normal.   - A BMI of 25 to 29.9 is considered overweight.   - A BMI of 30 and above is considered obese.   Maintain normal blood lipids and cholesterol levels by exercising and minimizing your intake of trans and saturated fats.  Eat a balanced diet with plenty of fruit and vegetables. Blood tests for lipids and cholesterol should begin at age 90 and be repeated every 5 years minimum.  If your lipid or cholesterol levels are high, you are over 40, or you are at high risk for heart disease, you may need your cholesterol levels checked more frequently.Ongoing high lipid and cholesterol levels should be treated with medicines if diet and exercise are not working.   If you smoke, find out from your health care provider how to quit. If you do not use tobacco, do not start.   Lung cancer screening is recommended for adults aged 48-80 years who are at high risk for developing lung cancer because of a history of smoking. A yearly low-dose CT scan of the lungs is recommended for people who have at least a 30-pack-year history of smoking and are a current smoker or have quit within the past 15 years. A pack year of smoking is smoking an average of 1 pack of cigarettes a day for 1 year (for example: 1 pack a day for 30 years or 2 packs a day for 15 years). Yearly screening should continue until the smoker has stopped smoking for at least 15 years.  Yearly screening should be stopped for people who develop a health problem that would prevent them from having lung cancer treatment.   If you are pregnant, do not drink alcohol. If you are breastfeeding, be very cautious about drinking alcohol. If you are not pregnant and choose to drink alcohol, do not have more than 1 drink per day. One drink is considered to be 12 ounces (355 mL) of beer, 5 ounces (148 mL) of wine, or 1.5 ounces (44 mL) of liquor.   Avoid use of street drugs. Do not share needles with anyone. Ask for help if you need support or  instructions about stopping the use of drugs.   High blood pressure causes heart disease and increases the risk of stroke. Your blood pressure should be checked at least yearly.  Ongoing high blood pressure should be treated with medicines if weight loss and exercise do not work.   If you are 56-64 years old, ask your health care provider if you should take aspirin to prevent strokes.   Diabetes screening involves taking a blood sample to check your fasting blood sugar level. This should be done once every 3 years, after age 59, if you are within normal weight and without risk factors for diabetes. Testing should be considered at a younger age or be carried out more frequently if you are overweight and have at least 1 risk factor for diabetes.   Breast cancer screening is essential preventive care for women. You should practice "breast self-awareness."  This means understanding the normal appearance and feel of your breasts and may include breast self-examination.  Any changes detected, no matter how small, should be reported to a health care provider.  Women in their 81s and 30s should have a clinical breast exam (CBE) by a health care provider as part of a regular health exam every 1 to 3 years.  After age 29, women should have a CBE every year.  Starting at age 59, women should consider having a mammogram (breast X-ray test) every year.  Women who have a family history of breast cancer should talk to their health care provider about genetic screening.  Women at a high risk of breast cancer should talk to their health care providers about having an MRI and a mammogram every year.   -Breast cancer gene (BRCA)-related cancer risk assessment is recommended for women who have family members with BRCA-related cancers. BRCA-related cancers include breast, ovarian, tubal, and peritoneal cancers. Having family members with these cancers may be associated with an increased risk for harmful changes (mutations)  in the breast cancer genes BRCA1 and BRCA2. Results of the assessment will determine the need for genetic counseling and BRCA1 and BRCA2 testing.   The Pap test is a screening test for cervical cancer. A Pap test can show cell changes on the cervix that might become cervical cancer if left untreated. A Pap test is a procedure in which cells are obtained and examined from the lower end of the uterus (cervix).   - Women should have a Pap test starting at age 76.   - Between ages 36 and 51, Pap tests should be repeated every 2 years.   - Beginning at age 68, you should have a Pap test every 3 years as long as the past 3 Pap tests have been normal.   - Some women have medical problems that increase the chance of getting cervical cancer. Talk to your health care provider about these problems. It is especially  important to talk to your health care provider if a new problem develops soon after your last Pap test. In these cases, your health care provider may recommend more frequent screening and Pap tests.   - The above recommendations are the same for women who have or have not gotten the vaccine for human papillomavirus (HPV).   - If you had a hysterectomy for a problem that was not cancer or a condition that could lead to cancer, then you no longer need Pap tests. Even if you no longer need a Pap test, a regular exam is a good idea to make sure no other problems are starting.   - If you are between ages 56 and 59 years, and you have had normal Pap tests going back 10 years, you no longer need Pap tests. Even if you no longer need a Pap test, a regular exam is a good idea to make sure no other problems are starting.   - If you have had past treatment for cervical cancer or a condition that could lead to cancer, you need Pap tests and screening for cancer for at least 20 years after your treatment.   - If Pap tests have been discontinued, risk factors (such as a new sexual partner) need to be  reassessed to determine if screening should be resumed.   - The HPV test is an additional test that may be used for cervical cancer screening. The HPV test looks for the virus that can cause the cell changes on the cervix. The cells collected during the Pap test can be tested for HPV. The HPV test could be used to screen women aged 29 years and older, and should be used in women of any age who have unclear Pap test results. After the age of 44, women should have HPV testing at the same frequency as a Pap test.   Colorectal cancer can be detected and often prevented. Most routine colorectal cancer screening begins at the age of 76 years and continues through age 49 years. However, your health care provider may recommend screening at an earlier age if you have risk factors for colon cancer. On a yearly basis, your health care provider may provide home test kits to check for hidden blood in the stool.  Use of a small camera at the end of a tube, to directly examine the colon (sigmoidoscopy or colonoscopy), can detect the earliest forms of colorectal cancer. Talk to your health care provider about this at age 67, when routine screening begins. Direct exam of the colon should be repeated every 5 -10 years through age 61 years, unless early forms of pre-cancerous polyps or small growths are found.   People who are at an increased risk for hepatitis B should be screened for this virus. You are considered at high risk for hepatitis B if:  -You were born in a country where hepatitis B occurs often. Talk with your health care provider about which countries are considered high risk.  - Your parents were born in a high-risk country and you have not received a shot to protect against hepatitis B (hepatitis B vaccine).  - You have HIV or AIDS.  - You use needles to inject street drugs.  - You live with, or have sex with, someone who has Hepatitis B.  - You get hemodialysis treatment.  - You take certain medicines  for conditions like cancer, organ transplantation, and autoimmune conditions.   Hepatitis C blood testing is recommended for  all people born from [birth year] through 1967 and any individual with known risks for hepatitis C.   Practice safe sex. Use condoms and avoid high-risk sexual practices to reduce the spread of sexually transmitted infections (STIs). STIs include gonorrhea, chlamydia, syphilis, trichomonas, herpes, HPV, and human immunodeficiency virus (HIV). Herpes, HIV, and HPV are viral illnesses that have no cure. They can result in disability, cancer, and death. Sexually active women aged 71 years and younger should be checked for chlamydia. Older women with new or multiple partners should also be tested for chlamydia. Testing for other STIs is recommended if you are sexually active and at increased risk.   Osteoporosis is a disease in which the bones lose minerals and strength with aging. This can result in serious bone fractures or breaks. The risk of osteoporosis can be identified using a bone density scan. Women ages 54 years and over and women at risk for fractures or osteoporosis should discuss screening with their health care providers. Ask your health care provider whether you should take a calcium supplement or vitamin D to There are also several preventive steps women can take to avoid osteoporosis and resulting fractures or to keep osteoporosis from worsening. -->Recommendations include:  Eat a balanced diet high in fruits, vegetables, calcium, and vitamins.  Get enough calcium. The recommended total intake of is 1,200 mg daily; for best absorption, if taking supplements, divide doses into 250-500 mg doses throughout the day. Of the two types of calcium, calcium carbonate is best absorbed when taken with food but calcium citrate can be taken on an empty stomach.  Get enough vitamin D. [employer] and the [place] recommend at least 1,000 IU per day for women age 21 and  over who are at risk of vitamin D deficiency. Vitamin D deficiency can be caused by inadequate sun exposure (for example, those who live in [place]).  Avoid alcohol and smoking. Heavy alcohol intake (more than 7 drinks per week) increases the risk of falls and hip fracture and women smokers tend to lose bone more rapidly and have lower bone mass than nonsmokers. Stopping smoking is one of the most important changes women can make to improve their health and decrease risk for disease.  Be physically active every day. Weight-bearing exercise (for example, fast walking, hiking, jogging, and weight training) may strengthen bones or slow the rate of bone loss that comes with aging. Balancing and muscle-strengthening exercises can reduce the risk of falling and fracture.  Consider therapeutic medications. Currently, several types of effective drugs are available. Healthcare providers can recommend the type most appropriate for each woman.  Eliminate environmental factors that may contribute to accidents. Falls cause nearly 90% of all osteoporotic fractures, so reducing this risk is an important bone-health strategy. Measures include ample lighting, removing obstructions to walking, using nonskid rugs on floors, and placing mats and/or grab bars in showers.  Be aware of medication side effects. Some common medicines make bones weaker. These include a type of steroid drug called glucocorticoids used for arthritis and asthma, some antiseizure drugs, certain sleeping pills, treatments for endometriosis, and some cancer drugs. An overactive thyroid gland or using too much thyroid hormone for an underactive thyroid can also be a problem. If you are taking these medicines, talk to your doctor about what you can do to help protect your bones.reduce the rate of osteoporosis.    Menopause can be associated with physical symptoms and risks. Hormone replacement therapy is available to decrease symptoms and  risks. You should talk to your health care provider about whether hormone replacement therapy is right for you.   Use sunscreen. Apply sunscreen liberally and repeatedly throughout the day. You should seek shade when your shadow is shorter than you. Protect yourself by wearing long sleeves, pants, a wide-brimmed hat, and sunglasses year round, whenever you are outdoors.   Once a month, do a whole body skin exam, using a mirror to look at the skin on your back. Tell your health care provider of new moles, moles that have irregular borders, moles that are larger than a pencil eraser, or moles that have changed in shape or color.   -Stay current with required vaccines (immunizations).   Influenza vaccine. All adults should be immunized every year.  Tetanus, diphtheria, and acellular pertussis (Td, Tdap) vaccine. Pregnant women should receive 1 dose of Tdap vaccine during each pregnancy. The dose should be obtained regardless of the length of time since the last dose. Immunization is preferred during the 27th 36th week of gestation. An adult who has not previously received Tdap or who does not know her vaccine status should receive 1 dose of Tdap. This initial dose should be followed by tetanus and diphtheria toxoids (Td) booster doses every 10 years. Adults with an unknown or incomplete history of completing a 3-dose immunization series with Td-containing vaccines should begin or complete a primary immunization series including a Tdap dose. Adults should receive a Td booster every 10 years.  Varicella vaccine. An adult without evidence of immunity to varicella should receive 2 doses or a second dose if she has previously received 1 dose. Pregnant females who do not have evidence of immunity should receive the first dose after pregnancy. This first dose should be obtained before leaving the health care facility. The second dose should be obtained 4 8 weeks after the first dose.  Human papillomavirus  (HPV) vaccine. Females aged 24 26 years who have not received the vaccine previously should obtain the 3-dose series. The vaccine is not recommended for use in pregnant females. However, pregnancy testing is not needed before receiving a dose. If a female is found to be pregnant after receiving a dose, no treatment is needed. In that case, the remaining doses should be delayed until after the pregnancy. Immunization is recommended for any person with an immunocompromised condition through the age of 63 years if she did not get any or all doses earlier. During the 3-dose series, the second dose should be obtained 4 8 weeks after the first dose. The third dose should be obtained 24 weeks after the first dose and 16 weeks after the second dose.  Zoster vaccine. One dose is recommended for adults aged 44 years or older unless certain conditions are present.  Measles, mumps, and rubella (MMR) vaccine. Adults born before [birth year] generally are considered immune to measles and mumps. Adults born in [birth year] or later should have 1 or more doses of MMR vaccine unless there is a contraindication to the vaccine or there is laboratory evidence of immunity to each of the three diseases. A routine second dose of MMR vaccine should be obtained at least 28 days after the first dose for students attending postsecondary schools, health care workers, or international travelers. People who received inactivated measles vaccine or an unknown type of measles vaccine during 1963 1967 should receive 2 doses of MMR vaccine. People who received inactivated mumps vaccine or an unknown type of mumps vaccine before 1979 and are at high risk for  mumps infection should consider immunization with 2 doses of MMR vaccine. For females of childbearing age, rubella immunity should be determined. If there is no evidence of immunity, females who are not pregnant should be vaccinated. If there is no evidence of immunity, females who are pregnant should delay  immunization until after pregnancy. Unvaccinated health care workers born before 29 who lack laboratory evidence of measles, mumps, or rubella immunity or laboratory confirmation of disease should consider measles and mumps immunization with 2 doses of MMR vaccine or rubella immunization with 1 dose of MMR vaccine.  Pneumococcal 13-valent conjugate (PCV13) vaccine. When indicated, a person who is uncertain of her immunization history and has no record of immunization should receive the PCV13 vaccine. An adult aged 33 years or older who has certain medical conditions and has not been previously immunized should receive 1 dose of PCV13 vaccine. This PCV13 should be followed with a dose of pneumococcal polysaccharide (PPSV23) vaccine. The PPSV23 vaccine dose should be obtained at least 8 weeks after the dose of PCV13 vaccine. An adult aged 33 years or older who has certain medical conditions and previously received 1 or more doses of PPSV23 vaccine should receive 1 dose of PCV13. The PCV13 vaccine dose should be obtained 1 or more years after the last PPSV23 vaccine dose.  Pneumococcal polysaccharide (PPSV23) vaccine. When PCV13 is also indicated, PCV13 should be obtained first. All adults aged 60 years and older should be immunized. An adult younger than age 47 years who has certain medical conditions should be immunized. Any person who resides in a nursing home or long-term care facility should be immunized. An adult smoker should be immunized. People with an immunocompromised condition and certain other conditions should receive both PCV13 and PPSV23 vaccines. People with human immunodeficiency virus (HIV) infection should be immunized as soon as possible after diagnosis. Immunization during chemotherapy or radiation therapy should be avoided. Routine use of PPSV23 vaccine is not recommended for American Indians, [place]atives, or people younger than 65 years unless there are medical conditions that require  PPSV23 vaccine. When indicated, people who have unknown immunization and have no record of immunization should receive PPSV23 vaccine. One-time revaccination 5 years after the first dose of PPSV23 is recommended for people aged 2 64 years who have chronic kidney failure, nephrotic syndrome, asplenia, or immunocompromised conditions. People who received 1 2 doses of PPSV23 before age 52 years should receive another dose of PPSV23 vaccine at age 24 years or later if at least 5 years have passed since the previous dose. Doses of PPSV23 are not needed for people immunized with PPSV23 at or after age 17 years.  Meningococcal vaccine. Adults with asplenia or persistent complement component deficiencies should receive 2 doses of quadrivalent meningococcal conjugate (MenACWY-D) vaccine. The doses should be obtained at least 2 months apart. Microbiologists working with certain meningococcal bacteria, [place] recruits, people at risk during an outbreak, and people who travel to or live in countries with a high rate of meningitis should be immunized. A first-year college student up through age 44 years who is living in a residence hall should receive a dose if she did not receive a dose on or after her 16th birthday. Adults who have certain high-risk conditions should receive one or more doses of vaccine.  Hepatitis A vaccine. Adults who wish to be protected from this disease, have certain high-risk conditions, work with hepatitis A-infected animals, work in hepatitis A research labs, or travel to or work in countries  with a high rate of hepatitis A should be immunized. Adults who were previously unvaccinated and who anticipate close contact with an international adoptee during the first 60 days after arrival in the [place] from a country with a high rate of hepatitis A should be immunized.  Hepatitis B vaccine.  Adults who wish to be protected from this disease, have certain high-risk conditions, may be exposed  to blood or other infectious body fluids, are household contacts or sex partners of hepatitis B positive people, are clients or workers in certain care facilities, or travel to or work in countries with a high rate of hepatitis B should be immunized.  Haemophilus influenzae type b (Hib) vaccine. A previously unvaccinated person with asplenia or sickle cell disease or having a scheduled splenectomy should receive 1 dose of Hib vaccine. Regardless of previous immunization, a recipient of a hematopoietic stem cell transplant should receive a 3-dose series 6 12 months after her successful transplant. Hib vaccine is not recommended for adults with HIV infection.  Preventive Services / Frequency Ages 54 to 39years  Blood pressure check.** / Every 1 to 2 years.  Lipid and cholesterol check.** / Every 5 years beginning at age 39.  Clinical breast exam.** / Every 3 years for women in their 70s and 53s.  BRCA-related cancer risk assessment.** / For women who have family members with a BRCA-related cancer (breast, ovarian, tubal, or peritoneal cancers).  Pap test.** / Every 2 years from ages 27 through 9. Every 3 years starting at age 40 through age 104 or 34 with a history of 3 consecutive normal Pap tests.  HPV screening.** / Every 3 years from ages 17 through ages 68 to 19 with a history of 3 consecutive normal Pap tests.  Hepatitis C blood test.** / For any individual with known risks for hepatitis C.  Skin self-exam. / Monthly.  Influenza vaccine. / Every year.  Tetanus, diphtheria, and acellular pertussis (Tdap, Td) vaccine.** / Consult your health care provider. Pregnant women should receive 1 dose of Tdap vaccine during each pregnancy. 1 dose of Td every 10 years.  Varicella vaccine.** / Consult your health care provider. Pregnant females who do not have evidence of immunity should receive the first dose after pregnancy.  HPV vaccine. / 3 doses over 6 months, if 30 and younger. The vaccine  is not recommended for use in pregnant females. However, pregnancy testing is not needed before receiving a dose.  Measles, mumps, rubella (MMR) vaccine.** / You need at least 1 dose of MMR if you were born in 1957 or later. You may also need a 2nd dose. For females of childbearing age, rubella immunity should be determined. If there is no evidence of immunity, females who are not pregnant should be vaccinated. If there is no evidence of immunity, females who are pregnant should delay immunization until after pregnancy.  Pneumococcal 13-valent conjugate (PCV13) vaccine.** / Consult your health care provider.  Pneumococcal polysaccharide (PPSV23) vaccine.** / 1 to 2 doses if you smoke cigarettes or if you have certain conditions.  Meningococcal vaccine.** / 1 dose if you are age 30 to 46 years and a Market researcher living in a residence hall, or have one of several medical conditions, you need to get vaccinated against meningococcal disease. You may also need additional booster doses.  Hepatitis A vaccine.** / Consult your health care provider.  Hepatitis B vaccine.** / Consult your health care provider.  Haemophilus influenzae type b (Hib) vaccine.** / Consult  your health care provider.  Ages 76 to 64years  Blood pressure check.** / Every 1 to 2 years.  Lipid and cholesterol check.** / Every 5 years beginning at age 61 years.  Lung cancer screening. / Every year if you are aged 54 80 years and have a 30-pack-year history of smoking and currently smoke or have quit within the past 15 years. Yearly screening is stopped once you have quit smoking for at least 15 years or develop a health problem that would prevent you from having lung cancer treatment.  Clinical breast exam.** / Every year after age 60 years.  BRCA-related cancer risk assessment.** / For women who have family members with a BRCA-related cancer (breast, ovarian, tubal, or peritoneal cancers).  Mammogram.** / Every  year beginning at age 8 years and continuing for as long as you are in good health. Consult with your health care provider.  Pap test.** / Every 3 years starting at age 26 years through age 87 or 52 years with a history of 3 consecutive normal Pap tests.  HPV screening.** / Every 3 years from ages 43 years through ages 37 to 84 years with a history of 3 consecutive normal Pap tests.  Fecal occult blood test (FOBT) of stool. / Every year beginning at age 69 years and continuing until age 37 years. You may not need to do this test if you get a colonoscopy every 10 years.  Flexible sigmoidoscopy or colonoscopy.** / Every 5 years for a flexible sigmoidoscopy or every 10 years for a colonoscopy beginning at age 61 years and continuing until age 100 years.  Hepatitis C blood test.** / For all people born from [birth year] through 1967 and any individual with known risks for hepatitis C.  Skin self-exam. / Monthly.  Influenza vaccine. / Every year.  Tetanus, diphtheria, and acellular pertussis (Tdap/Td) vaccine.** / Consult your health care provider. Pregnant women should receive 1 dose of Tdap vaccine during each pregnancy. 1 dose of Td every 10 years.  Varicella vaccine.** / Consult your health care provider. Pregnant females who do not have evidence of immunity should receive the first dose after pregnancy.  Zoster vaccine.** / 1 dose for adults aged 68 years or older.  Measles, mumps, rubella (MMR) vaccine.** / You need at least 1 dose of MMR if you were born in 1957 or later. You may also need a 2nd dose. For females of childbearing age, rubella immunity should be determined. If there is no evidence of immunity, females who are not pregnant should be vaccinated. If there is no evidence of immunity, females who are pregnant should delay immunization until after pregnancy.  Pneumococcal 13-valent conjugate (PCV13) vaccine.** / Consult your health care provider.  Pneumococcal polysaccharide (PPSV23)  vaccine.** / 1 to 2 doses if you smoke cigarettes or if you have certain conditions.  Meningococcal vaccine.** / Consult your health care provider.  Hepatitis A vaccine.** / Consult your health care provider.  Hepatitis B vaccine.** / Consult your health care provider.  Haemophilus influenzae type b (Hib) vaccine.** / Consult your health care provider.  Ages 42 years and over  Blood pressure check.** / Every 1 to 2 years.  Lipid and cholesterol check.** / Every 5 years beginning at age 58 years.  Lung cancer screening. / Every year if you are aged 74 80 years and have a 30-pack-year history of smoking and currently smoke or have quit within the past 15 years. Yearly screening is stopped once you have quit smoking for  at least 15 years or develop a health problem that would prevent you from having lung cancer treatment.  Clinical breast exam.** / Every year after age 74 years.  BRCA-related cancer risk assessment.** / For women who have family members with a BRCA-related cancer (breast, ovarian, tubal, or peritoneal cancers).  Mammogram.** / Every year beginning at age 11 years and continuing for as long as you are in good health. Consult with your health care provider.  Pap test.** / Every 3 years starting at age 43 years through age 44 or 71 years with 3 consecutive normal Pap tests. Testing can be stopped between 65 and 70 years with 3 consecutive normal Pap tests and no abnormal Pap or HPV tests in the past 10 years.  HPV screening.** / Every 3 years from ages 5 years through ages 92 or 89 years with a history of 3 consecutive normal Pap tests. Testing can be stopped between 65 and 70 years with 3 consecutive normal Pap tests and no abnormal Pap or HPV tests in the past 10 years.  Fecal occult blood test (FOBT) of stool. / Every year beginning at age 108 years and continuing until age 30 years. You may not need to do this test if you get a colonoscopy every 10 years.  Flexible  sigmoidoscopy or colonoscopy.** / Every 5 years for a flexible sigmoidoscopy or every 10 years for a colonoscopy beginning at age 71 years and continuing until age 77 years.  Hepatitis C blood test.** / For all people born from [birth year] through 1967 and any individual with known risks for hepatitis C.  Osteoporosis screening.** / A one-time screening for women ages 9 years and over and women at risk for fractures or osteoporosis.  Skin self-exam. / Monthly.  Influenza vaccine. / Every year.  Tetanus, diphtheria, and acellular pertussis (Tdap/Td) vaccine.** / 1 dose of Td every 10 years.  Varicella vaccine.** / Consult your health care provider.  Zoster vaccine.** / 1 dose for adults aged 25 years or older.  Pneumococcal 13-valent conjugate (PCV13) vaccine.** / Consult your health care provider.  Pneumococcal polysaccharide (PPSV23) vaccine.** / 1 dose for all adults aged 59 years and older.  Meningococcal vaccine.** / Consult your health care provider.  Hepatitis A vaccine.** / Consult your health care provider.  Hepatitis B vaccine.** / Consult your health care provider.  Haemophilus influenzae type b (Hib) vaccine.** / Consult your health care provider. ** Family history and personal history of risk and conditions may change your health care provider's recommendations. Document Released: 10/17/2001 Document Revised: 06/11/2013  [facility] Patient Information 2014 [place], [place].   EXERCISE AND DIET:  We recommended that you start or continue a regular exercise program for good health. Regular exercise means any activity that makes your heart beat faster and makes you sweat.  We recommend exercising at least 30 minutes per day at least 3 days a week, preferably 5.  We also recommend a diet low in fat and sugar / carbohydrates.  Inactivity, poor dietary choices and obesity can cause diabetes, heart attack, stroke, and kidney damage, among others.     ALCOHOL AND SMOKING:  Women should  limit their alcohol intake to no more than 7 drinks/beers/glasses of wine (combined, not each!) per week. Moderation of alcohol intake to this level decreases your risk of breast cancer and liver damage.  ( And of course, no recreational drugs are part of a healthy lifestyle.)  Also, you should not be smoking at all or even being  exposed to second hand smoke. Most people know smoking can cause cancer, and various heart and lung diseases, but did you know it also contributes to weakening of your bones?  Aging of your skin?  Yellowing of your teeth and nails?   CALCIUM AND VITAMIN D:  Adequate intake of calcium and Vitamin D are recommended.  The recommendations for exact amounts of these supplements seem to change often, but generally speaking 600 mg of calcium (either carbonate or citrate) and 800 units of Vitamin D per day seems prudent. Certain women may benefit from higher intake of Vitamin D.  If you are among these women, your doctor will have told you during your visit.     PAP SMEARS:  Pap smears, to check for cervical cancer or precancers,  have traditionally been done yearly, although recent scientific advances have shown that most women can have pap smears less often.  However, every woman still should have a physical exam from her gynecologist or primary care physician every year. It will include a breast check, inspection of the vulva and vagina to check for abnormal growths or skin changes, a visual exam of the cervix, and then an exam to evaluate the size and shape of the uterus and ovaries.  And after 40 years of age, a rectal exam is indicated to check for rectal cancers. We will also provide age appropriate advice regarding health maintenance, like when you should have certain vaccines, screening for sexually transmitted diseases, bone density testing, colonoscopy, mammograms, etc.    MAMMOGRAMS:  All women over 35 years old should have a yearly mammogram. Many facilities now offer a  "3D" mammogram, which may cost around $50 extra out of pocket. If possible,  we recommend you accept the option to have the 3D mammogram performed.  It both reduces the number of women who will be called back for extra views which then turn out to be normal, and it is better than the routine mammogram at detecting truly abnormal areas.     COLONOSCOPY:  Colonoscopy to screen for colon cancer is recommended for all women at age 32.  We know, you hate the idea of the prep.  We agree, BUT, having colon cancer and not knowing it is worse!!  Colon cancer so often starts as a polyp that can be seen and removed at colonscopy, which can quite literally save your life!  And if your first colonoscopy is normal and you have no family history of colon cancer, most women don't have to have it again for 10 years.  Once every ten years, you can do something that may end up saving your life, right?  We will be happy to help you get it scheduled when you are ready.  Be sure to check your insurance coverage so you understand how much it will cost.  It may be covered as a preventative service at no cost, but you should check your particular policy.

## 2018-10-21 LAB — COMPREHENSIVE METABOLIC PANEL
ALBUMIN: 4.7 g/dL (ref 3.8–4.8)
ALT: 20 IU/L (ref 0–32)
AST: 20 IU/L (ref 0–40)
Albumin/Globulin Ratio: 2 (ref 1.2–2.2)
Alkaline Phosphatase: 79 IU/L (ref 39–117)
BUN/Creatinine Ratio: 14 (ref 9–23)
BUN: 11 mg/dL (ref 6–20)
Bilirubin Total: 0.4 mg/dL (ref 0.0–1.2)
CO2: 25 mmol/L (ref 20–29)
Calcium: 9.8 mg/dL (ref 8.7–10.2)
Chloride: 99 mmol/L (ref 96–106)
Creatinine, Ser: 0.76 mg/dL (ref 0.57–1.00)
GFR calc non Af Amer: 99 mL/min/{1.73_m2} (ref >59)
GFR, EST AFRICAN AMERICAN: 114 mL/min/{1.73_m2} (ref >59)
GLOBULIN, TOTAL: 2.3 g/dL (ref 1.5–4.5)
Glucose: 100 mg/dL — ABNORMAL HIGH (ref 65–99)
Potassium: 4.3 mmol/L (ref 3.5–5.2)
Sodium: 141 mmol/L (ref 134–144)
TOTAL PROTEIN: 7 g/dL (ref 6.0–8.5)

## 2018-10-21 LAB — LIPID PANEL
Chol/HDL Ratio: 5.2 ratio — ABNORMAL HIGH (ref 0.0–4.4)
Cholesterol, Total: 228 mg/dL — ABNORMAL HIGH (ref 100–199)
HDL: 44 mg/dL (ref >39)
LDL CALC: 159 mg/dL — AB (ref 0–99)
Triglycerides: 127 mg/dL (ref 0–149)
VLDL Cholesterol Cal: 25 mg/dL (ref 5–40)

## 2018-10-21 LAB — T4, FREE: Free T4: 1.5 ng/dL (ref 0.82–1.77)

## 2018-10-21 LAB — CBC WITH DIFFERENTIAL/PLATELET
Basophils Absolute: 0 10*3/uL (ref 0.0–0.2)
Basos: 0 %
EOS (ABSOLUTE): 0.2 10*3/uL (ref 0.0–0.4)
Eos: 2 %
Hematocrit: 44 % (ref 34.0–46.6)
Hemoglobin: 15 g/dL (ref 11.1–15.9)
Immature Grans (Abs): 0 10*3/uL (ref 0.0–0.1)
Immature Granulocytes: 0 %
Lymphocytes Absolute: 3.1 10*3/uL (ref 0.7–3.1)
Lymphs: 33 %
MCH: 30.4 pg (ref 26.6–33.0)
MCHC: 34.1 g/dL (ref 31.5–35.7)
MCV: 89 fL (ref 79–97)
Monocytes Absolute: 0.7 10*3/uL (ref 0.1–0.9)
Monocytes: 7 %
Neutrophils Absolute: 5.3 10*3/uL (ref 1.4–7.0)
Neutrophils: 58 %
Platelets: 323 10*3/uL (ref 150–450)
RBC: 4.93 x10E6/uL (ref 3.77–5.28)
RDW: 13.2 % (ref 11.7–15.4)
WBC: 9.4 10*3/uL (ref 3.4–10.8)

## 2018-10-21 LAB — INSULIN, FREE AND TOTAL
Free Insulin: 23 uU/mL — ABNORMAL HIGH
Total Insulin: 23 uU/mL

## 2018-10-21 LAB — VITAMIN D 25 HYDROXY (VIT D DEFICIENCY, FRACTURES): Vit D, 25-Hydroxy: 98 ng/mL (ref 30.0–100.0)

## 2018-10-21 LAB — TSH: TSH: 1.8 u[IU]/mL (ref 0.450–4.500)

## 2018-10-21 LAB — HEMOGLOBIN A1C
Est. average glucose Bld gHb Est-mCnc: 123 mg/dL
Hgb A1c MFr Bld: 5.9 % — ABNORMAL HIGH (ref 4.8–5.6)

## 2018-10-22 ENCOUNTER — Encounter: Payer: Self-pay | Admitting: Family Medicine

## 2018-10-28 ENCOUNTER — Encounter: Payer: Self-pay | Admitting: Family Medicine

## 2018-10-30 ENCOUNTER — Other Ambulatory Visit: Payer: Self-pay | Admitting: Family Medicine

## 2018-10-30 DIAGNOSIS — I10 Essential (primary) hypertension: Secondary | ICD-10-CM

## 2018-11-04 ENCOUNTER — Ambulatory Visit: Payer: No Typology Code available for payment source | Admitting: Internal Medicine

## 2018-11-29 ENCOUNTER — Ambulatory Visit: Payer: No Typology Code available for payment source | Admitting: Internal Medicine

## 2018-12-25 ENCOUNTER — Ambulatory Visit (INDEPENDENT_AMBULATORY_CARE_PROVIDER_SITE_OTHER): Payer: No Typology Code available for payment source | Admitting: Internal Medicine

## 2018-12-25 ENCOUNTER — Other Ambulatory Visit: Payer: Self-pay

## 2018-12-25 ENCOUNTER — Encounter: Payer: Self-pay | Admitting: Internal Medicine

## 2018-12-25 DIAGNOSIS — E282 Polycystic ovarian syndrome: Secondary | ICD-10-CM | POA: Diagnosis not present

## 2018-12-25 MED ORDER — DEXAMETHASONE 1 MG PO TABS: ORAL_TABLET | ORAL | 0 refills | Status: DC

## 2018-12-25 NOTE — Progress Notes (Signed)
Patient ID: Vickie [name], female   DOB: [birth year], 38 y.o.   MRN: [id number]   Patient location: Home My location: Office  Referring Provider: [name], DO  I connected with the patient on 12/25/18 at 11:03 AM EDT by a video enabled telemedicine application and verified that I am speaking with the correct person.   I discussed the limitations of evaluation and management by telemedicine and the availability of in person appointments. The patient expressed understanding and agreed to proceed.   Details of the encounter are shown below.  HPI: Vickie [name] is a 38 y.o. female, referred by Dr. Sharee Holster, for evaluation for PCOS and obesity.  She was diagnosed with PCOS by OB/GYN 3-4 years ago (clinical diagnosis, reportedly).  I do not have the records of the previous investigation.  She was recently seen by PCP and she was investigated for metabolic syndrome.  Of note, she does have a history of hypertension, prediabetes (insulin level from 10/2018: 23), hyperlipidemia/HTG, severe NASH (personal review of the CT report from 05/2016) with increased LFTs, vitamin D deficiency (she was started on ergocalciferol).   She also has moonlike facies, hirsutism, and buffalo hump that prompted the PCP to refer her to endocrinology to rule out Cushing's syndrome.  Fertility/Menstrual cycles: - she has irregular menses - has had even 1 year b/w periods; now 1 cycle every 4-6 mo; was on Provera >> excessive bleeding - stopped; LMP: 12/21/2018 - + h/o 1 ovarian cyst - removed surgically - children: 1 daughter, 1 son - miscarriages: None - contraception: Bilateral tubal ligation - No h/o OCP  Acne: - no  Hirsutism: - "everywhere" - mostly on face - waxes, shaving daily  Weight gain: - 267 lbs 8 mo ago   - but fell off the wagon for the Holidays, but then restarted >> lost to 227 lbs! - no weight loss meds - Meals: - Breakfast: English muffin + PB (reduced fat) +  grapes, coffee - Lunch: 1/2 cup rice, chicken, tomatoes and cucumbers - Dinner: e.g. mexican chicken + chipotle sauce, 2 tostadas, watermelon, icecream Drinks: water - Diets tried: [employer] Watchers - Exercise: not consistently, but started to go for hikes-for example, yesterday she walked for 4 miles, which she could not have done a year ago.  No history Depo-Provera use. She gets Prednisone with sinus infections - 2x in last 4 years, prior to this 4x a year.  She denies wide, purple, stretch marks.  Treatments tried: - did not try Metformin in the past but started this (500 mg daily, decreased from 2x a day 2/2 increased hunger) 10/2018 by PCP - did not try Spironolactone - did not try Vaniqa - not on OCPs  Latest thyroid tests were normal: Lab Results  Component Value Date   TSH 1.800 10/15/2018   FREET4 1.50 10/15/2018   Latest set of lipids:    Component Value Date/Time   CHOL 228 (H) 10/15/2018 0904   TRIG 127 10/15/2018 0904   HDL 44 10/15/2018 0904   CHOLHDL 5.2 (H) 10/15/2018 0904   LDLCALC 159 (H) 10/15/2018 0904   Last HbA1c was in the prediabetic range: Lab Results  Component Value Date   HGBA1C 5.9 (H) 10/15/2018   She has FH of DM in mother, father, sister, aunts.   ROS: Constitutional: no weight gain/+ weight loss, no fatigue, no subjective hyperthermia/hypothermia Eyes: no blurry vision, no xerophthalmia ENT: no sore throat, no nodules palpated in throat, no dysphagia/odynophagia, no hoarseness Cardiovascular: no CP/SOB/palpitations/leg  swelling Respiratory: no cough/SOB Gastrointestinal: no N/V/D/C Musculoskeletal: no muscle/joint aches Skin: No acne, + dark hair on face, especially chin Neurological: no tremors/numbness/tingling/dizziness Psychiatric: no depression/anxiety  Past Medical History:  Diagnosis Date  . Abnormal Pap smear   . Anxiety   . Broken arm    Left   . Complication of anesthesia    Epidural- baby's HR decreased ; developed   fever  . Fatigue 2008  . H/O dyspareunia 01/23/11  . H/O varicella   . Hx of candidiasis   . Hx: UTI (urinary tract infection)   . Hypertension   . Increased BMI   . Irregular periods/menstrual cycles 2008  . Postpartum depression   . Vaginal lesion 2009    Painful  . Vaginal pain 01/23/11   Past Surgical History:  Procedure Laterality Date  . repair broken arm    . TONSILLECTOMY     Age 34  . TUBAL LIGATION    . WISDOM TOOTH EXTRACTION     Social History   Socioeconomic History  . Marital status: Married    Spouse name: Not on file  . Number of children: Not on file  . Years of education: Not on file  . Highest education level: Not on file  Occupational History  . Not on file  Social Needs  . Financial resource strain: Not on file  . Food insecurity:    Worry: Not on file    Inability: Not on file  . Transportation needs:    Medical: Not on file    Non-medical: Not on file  Tobacco Use  . Smoking status: Never Smoker  . Smokeless tobacco: Never Used  Substance and Sexual Activity  . Alcohol use: Yes    Alcohol/week: 1.0 standard drinks    Types: 1 Standard drinks or equivalent per week    Comment: social  . Drug use: No  . Sexual activity: Never    Partners: Male    Comment: BTL  Lifestyle  . Physical activity:    Days per week: Not on file    Minutes per session: Not on file  . Stress: Not on file  Relationships  . Social connections:    Talks on phone: Not on file    Gets together: Not on file    Attends religious service: Not on file    Active member of club or organization: Not on file    Attends meetings of clubs or organizations: Not on file    Relationship status: Not on file  . Intimate partner violence:    Fear of current or ex partner: Not on file    Emotionally abused: Not on file    Physically abused: Not on file    Forced sexual activity: Not on file  Other Topics Concern  . Not on file  Social History Narrative  . Not on file    Current Outpatient Medications on File Prior to Visit  Medication Sig Dispense Refill  . albuterol (PROVENTIL HFA;VENTOLIN HFA) 108 (90 Base) MCG/ACT inhaler Inhale 2 puffs into the lungs every 6 (six) hours as needed for wheezing or shortness of breath.    Marland Kitchen aspirin (BAYER LOW DOSE) 81 MG EC tablet Take 81 mg by mouth daily. Swallow whole.    . Cholecalciferol (VITAMIN D3) 5000 units TABS 5,000 IU OTC vitamin D3 daily. (Patient taking differently: Take 2 tablets by mouth daily. 5,000 IU OTC vitamin D3 daily.) 90 tablet 3  . hydrochlorothiazide (HYDRODIURIL) 12.5 MG tablet TAKE 1  TABLET BY MOUTH EVERY DAY 90 tablet 0  . losartan (COZAAR) 100 MG tablet Take 0.5 tablets (50 mg total) by mouth daily. 45 tablet 0  . metFORMIN (GLUCOPHAGE) 500 MG tablet Take 1 tablet (500 mg total) by mouth 2 (two) times daily with a meal. 180 tablet 0  . Multiple Vitamin (MULTIVITAMIN) capsule Take 1 capsule by mouth daily.    Marland Kitchen. oxybutynin (DITROPAN) 5 MG tablet TAKE 1 TABLET BY MOUTH THREE TIMES A DAY 90 tablet 0  . pantoprazole (PROTONIX) 40 MG tablet TAKE 1 TABLET BY MOUTH EVERY DAY 30 tablet 5  . Vitamin D, Ergocalciferol, (DRISDOL) 50000 units CAPS capsule TAKE 1 CAPSULE (50,000 UNITS TOTAL) BY MOUTH EVERY 7 (SEVEN) DAYS. 4 capsule 32   No current facility-administered medications on file prior to visit.    No Known Allergies Family History  Problem Relation Age of Onset  . Hypertension Mother   . Diabetes Mother   . Cancer Mother        skin  . Leukemia Mother   . Diabetes Father   . Hypertension Father   . COPD Father   . Stroke Maternal Grandmother     PE: There were no vitals taken for this visit. Wt Readings from Last 3 Encounters:  10/15/18 234 lb 8 oz (106.4 kg)  07/31/18 238 lb (108 kg)  05/23/18 241 lb 6.4 oz (109.5 kg)   Constitutional:  in NAD  The physical exam was not performed (virtual visit).  ASSESSMENT: 1. PCOS  2.  Obesity class III  PLAN: 1.  I had a long  discussion with the patient about the fact that the PCOS is a misnomer, a patient does not necessarily have to have polycystic ovaries to be diagnosed with the disorder. This is mostly a clinical diagnosis and a sum of several conditions, including:  weight gain (she already started to lose a significant amount of weight in the last year and she is doing well on weight watchers)  insulin resistance (and therefore a higher risk of developing diabetes later in life).  We reviewed her most recent HbA1c which was higher than normal, in the prediabetic range.  I explained that weight loss and improving her insulin resistance is able to reverse prediabetes.  acne  hirsutism  irregular menstrual cycles (lately she had a menstrual cycle every 4 to 6 months and she could not tolerate Provera well -with losing weight and adding even a low-dose prednisone, she would likely start having more frequent cycles.  I explained that ideally she will have at least 1 every 3 months to maintain the health of her bones and prevent endometrial cancer.  If she has less than this, we will consider adding low-dose OCPs.  decreased fertility (she is not interested in having another child) -PCP was worried about the possibility of Cushing syndrome and I explained that this is also associated with weight gain, just like PCOS.  Patient gives me a history of frequent prednisone courses in the past for allergies, but less frequently in the last 4 years after she changed her workplace.  She could definitely have had exogenous Cushing's syndrome with no persistent cushingoid phenotype.  However, we will rule out active Cushing's syndrome by performing a dexamethasone suppression test.  I advised her to take a dexamethasone tablet at 11 PM the night before coming for an 8 a.m. cortisol test. -We discussed about the fact that PCOS treatment is usually targeted to addressing the problem that concerns the patient  the most: acne/hirsutism,  weight gain, or fertility, but there is no single treatment for PCOS.  Weight loss, however, comes close to being a universal treatment tool.   -If the patient is concerned with irregular menstrual cycles and also acne/hirsutism, we can add OCPs as a first-line and also possibly spironolactone.  If the patient is concerned with weight, metformin can be used (she already started this, but she is only using 1 tablet a day as she felt that a higher dose increased hunger - I advised her to retry the higher dose in few months).  If the patient is concerned about fertility, she may be referred to reproductive endocrinology for possible use of clomiphene.  She is not interested in another pregnancy and she did have tubal ligation.  She does have hirsutism and we may try Spironolactone after confirming her diagnosis of PCOS.  2.  Obesity class III -She did a great job losing weight in the last year (40 pounds)- this will improve not only her PCOS and prediabetes, but also her NASH, which was worsening for the last CT from 2017. -Metformin should also help with weight loss along with her continuing on the above diet -Hypothyroidism was ruled out recently by PCP -We will also rule out Cushing's syndrome, as mentioned above -We discussed about the need to start a consistent exercise regimen, but she will need to start slow and advanced only as she tolerates  Orders Placed This Encounter  Procedures  . Testosterone Free with SHBG  . Estradiol  . Androstenedione  . 17-Hydroxyprogesterone  . DHEA-Sulfate, Serum  . Luteinizing hormone  . Follicle stimulating hormone  . Prolactin   I will addend the results when they become available.  Carlus Pavlov, MD PhD [facility]

## 2018-12-25 NOTE — Patient Instructions (Addendum)
Please come for labs as soon as possible, fasting.  Please come back fasting for a cortisol level after you take a tablet of dexamethasone at 10-11 PM the night before.  Please come back for a follow-up appointment in 6 months.

## 2019-01-15 ENCOUNTER — Other Ambulatory Visit: Payer: Self-pay | Admitting: Family Medicine

## 2019-01-15 DIAGNOSIS — E282 Polycystic ovarian syndrome: Secondary | ICD-10-CM

## 2019-01-15 DIAGNOSIS — R7303 Prediabetes: Secondary | ICD-10-CM

## 2019-01-15 DIAGNOSIS — E66813 Obesity, class 3: Secondary | ICD-10-CM

## 2019-01-15 DIAGNOSIS — R748 Abnormal levels of other serum enzymes: Secondary | ICD-10-CM

## 2019-01-27 ENCOUNTER — Other Ambulatory Visit: Payer: Self-pay | Admitting: Family Medicine

## 2019-01-27 DIAGNOSIS — I10 Essential (primary) hypertension: Secondary | ICD-10-CM

## 2019-01-29 ENCOUNTER — Encounter: Payer: Self-pay | Admitting: Family Medicine

## 2019-01-29 NOTE — Telephone Encounter (Signed)
Vickie [name] is this something that you can check this for the patient.  Thanks

## 2019-02-13 ENCOUNTER — Other Ambulatory Visit: Payer: Self-pay | Admitting: Family Medicine

## 2019-02-13 DIAGNOSIS — E282 Polycystic ovarian syndrome: Secondary | ICD-10-CM

## 2019-02-13 DIAGNOSIS — R7303 Prediabetes: Secondary | ICD-10-CM

## 2019-02-13 DIAGNOSIS — R748 Abnormal levels of other serum enzymes: Secondary | ICD-10-CM

## 2019-02-13 DIAGNOSIS — E66813 Obesity, class 3: Secondary | ICD-10-CM

## 2019-02-13 MED ORDER — PANTOPRAZOLE SODIUM 40 MG PO TBEC: 40.0000 mg | DELAYED_RELEASE_TABLET | Freq: Every day | ORAL | 0 refills | Status: DC

## 2019-02-23 ENCOUNTER — Other Ambulatory Visit: Payer: Self-pay | Admitting: Family Medicine

## 2019-02-23 DIAGNOSIS — E66813 Obesity, class 3: Secondary | ICD-10-CM

## 2019-02-23 DIAGNOSIS — E282 Polycystic ovarian syndrome: Secondary | ICD-10-CM

## 2019-02-23 DIAGNOSIS — R7303 Prediabetes: Secondary | ICD-10-CM

## 2019-02-23 DIAGNOSIS — I10 Essential (primary) hypertension: Secondary | ICD-10-CM

## 2019-02-23 DIAGNOSIS — R748 Abnormal levels of other serum enzymes: Secondary | ICD-10-CM

## 2019-03-06 ENCOUNTER — Other Ambulatory Visit: Payer: Self-pay | Admitting: Family Medicine

## 2019-03-10 ENCOUNTER — Telehealth: Payer: Self-pay | Admitting: Family Medicine

## 2019-03-10 NOTE — Telephone Encounter (Signed)
-----   Message from Jerilee Field, [place] sent at 01/28/2019 10:06 AM EDT ----- Patient is due for follow up, please call the patient to make an appointment.  Thanks. MPulliam, CMA/RT(R)

## 2019-03-10 NOTE — Telephone Encounter (Signed)
FYI to medical assistant left pt msg to contact office to set up provider required OV.  --glh

## 2019-03-20 ENCOUNTER — Other Ambulatory Visit: Payer: Self-pay | Admitting: Family Medicine

## 2019-03-20 ENCOUNTER — Encounter: Payer: Self-pay | Admitting: Family Medicine

## 2019-03-20 DIAGNOSIS — R7303 Prediabetes: Secondary | ICD-10-CM

## 2019-03-20 DIAGNOSIS — E282 Polycystic ovarian syndrome: Secondary | ICD-10-CM

## 2019-03-20 DIAGNOSIS — I10 Essential (primary) hypertension: Secondary | ICD-10-CM

## 2019-03-20 DIAGNOSIS — R748 Abnormal levels of other serum enzymes: Secondary | ICD-10-CM

## 2019-03-20 DIAGNOSIS — E66813 Obesity, class 3: Secondary | ICD-10-CM

## 2019-04-09 ENCOUNTER — Encounter: Payer: Self-pay | Admitting: Family Medicine

## 2019-04-09 ENCOUNTER — Other Ambulatory Visit: Payer: Self-pay

## 2019-04-09 ENCOUNTER — Ambulatory Visit (INDEPENDENT_AMBULATORY_CARE_PROVIDER_SITE_OTHER): Payer: No Typology Code available for payment source | Admitting: Family Medicine

## 2019-04-09 VITALS — HR 85 | Ht 62.0 in | Wt 228.2 lb

## 2019-04-09 DIAGNOSIS — R7303 Prediabetes: Secondary | ICD-10-CM | POA: Diagnosis not present

## 2019-04-09 DIAGNOSIS — E282 Polycystic ovarian syndrome: Secondary | ICD-10-CM

## 2019-04-09 DIAGNOSIS — I10 Essential (primary) hypertension: Secondary | ICD-10-CM

## 2019-04-09 DIAGNOSIS — E559 Vitamin D deficiency, unspecified: Secondary | ICD-10-CM

## 2019-04-09 DIAGNOSIS — R748 Abnormal levels of other serum enzymes: Secondary | ICD-10-CM | POA: Diagnosis not present

## 2019-04-09 DIAGNOSIS — E66813 Obesity, class 3: Secondary | ICD-10-CM

## 2019-04-09 MED ORDER — METFORMIN HCL 500 MG PO TABS: 500.0000 mg | ORAL_TABLET | Freq: Two times a day (BID) | ORAL | 0 refills | Status: DC

## 2019-04-09 NOTE — Progress Notes (Signed)
Virtual / live video office visit note for [employer], D.O- Primary Care Physician at [facility]   I connected with current patient today and beyond visually recognizing the correct individual, I verified that I am speaking with the correct person using two identifiers.  . Location of the patient: Home . Location of the provider: Office Only the patient (+/- their family members at pt's discretion) and myself were participating in the encounter    - This visit type was conducted due to national recommendations for restrictions regarding the COVID-19 Pandemic (e.g. social distancing) in an effort to limit this patient's exposure and mitigate transmission in our community.  This format is felt to be most appropriate for this patient at this time.   - The patient did have access to video technology today  - No physical exam could be performed with this format, beyond that communicated to [place] by the patient/ family members as noted.   - Additionally my office staff/ schedulers discussed with the patient that there may be a monetary charge related to this service, depending on patient's medical insurance.   The patient expressed understanding, and agreed to proceed.      History of Present Illness:   Lifestyle Since Last Visit - Weight Maintained States since last visit in February, she hasn't gained but hasn't hit any goals.  Says "It's my fault" she hasn't met any goals.  "I just know I haven't been eating the way that I need to."  Has been working out more, but thinks that with working out more, she's been eating more.  When asked what type of exercises she's doing, "I've just been kinda changing [my workouts] around.  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  Does kickboxing and leg stretches as well.  States "trying to tone up the fat on my inner  thighs."  Bibo followed up with eye doctor as recommended. Notes she was given glasses to wear while on the computer "but that's it," no other findings.  Knee Concerns, Onset 1 Year Ago About a year ago now, she was doing squats; states she felt something pop in her leg.  Denies history of knee injury otherwise.  At that time, she says it felt like a "temporary pain," and she "didn't think anything about it."  Then that night, she went down to get on the floor and the area felt sore.  Now, there's a "knot" on her right knee near her kneecap. Says it's "a pretty profound knot, I can feel it now."  Denies pain or dysfunction of knee.  Just states she has some issues with some exercises on her knees because the area hurts.  She also cannot engage in yoga poses on her knees.  Denies worsening or changing since then.  Says the area prevents her from doing lunges and squats the way she would like to do them.  The area of pain hasn't moved since onset.   Female Health & PCOS Followed up with Dr. Letta Median and really liked her.  States she has to have just finished her period to have her labs drawn.   Vitamin D Deficiency & Weekly Supplementation Takes one vitamin D per week every Monday, not the daily.   Prediabetes & Metformin Patient is only taking one metformin daily.  Says "  when I take two, that thing makes me want to eat everything in my house."  Says "it's like I can't get enough to eat when I take two."  Dr. Letta Median also encouraged patient to take two tablets of metformin daily.  When patient did try to take two tablets, she took one in the morning and one at night.  Last A1C in the office was:  Lab Results  Component Value Date   HGBA1C 5.9 (H) 10/15/2018   HGBA1C 6.2 (H) 03/19/2018   HGBA1C 6.0 (H) 03/20/2017    Lab Results  Component Value Date   LDLCALC 159 (H) 10/15/2018   CREATININE 0.76 10/15/2018    Wt Readings from Last 3 Encounters:  04/09/19 228  lb 3.2 oz (103.5 kg)  10/15/18 234 lb 8 oz (106.4 kg)  07/31/18 238 lb (108 kg)    1. HTN HPI:  -  Her blood pressure has been controlled at home.  Pt is occasionally checking it at home.   Sometimes her blood pressure goes up to 135/89, but not higher.  Says sometimes if she's had an irritated day, her lower number is higher.  States "dealing with an 69 year old who wants me to treat him like he's 16 while he's acting [age]."  - Patient reports good compliance with blood pressure medications  - Denies medication S-E   - Smoking Status noted   - She denies new onset of: chest pain, exercise intolerance, shortness of breath, dizziness, visual changes, headache, lower extremity swelling or claudication.   Last 3 blood pressure readings in our office are as follows: BP Readings from Last 3 Encounters:  10/15/18 111/79  07/31/18 119/83  05/23/18 116/81    Filed Weights   04/09/19 1556  Weight: 228 lb 3.2 oz (103.5 kg)      Impression and Recommendations:    1. HTN, goal below 130/80   2. Obesity, Class III, BMI 40-49.9 (morbid obesity) (HCC)   3. Elevated liver enzymes   4. Prediabetes   5. PCOS (polycystic ovarian syndrome)   6. Vitamin D deficiency     PCOS / hormonal concerns - Patient knows to get in touch with Dr. Letta Median for details of labs and for next steps.  - Patient will have labs drawn here at our office per Dr. Thayer Ohm recommendations.  - Will continue to monitor.   Prediabetes - Glucose Intolerance - Need for A1c re-check in near future.  - Per patient, cannot tolerate taking two tablets daily due to increased appetite. - Advised patient to try to take one metformin in the morning and a half tablet in the evening. - Otherwise, continue management as prescribed.  - Counseled patient on prevention of disease and discussed various treatment options, which often includes dietary and lifestyle modifications as first line.  Importance of low prudent, low  carb diet discussed with patient in addition to regular exercise.   - Will continue to monitor.   HTN, goal below 130/80  - Per patient, BP occasionally elevated to 135/89 at home. - Discussed goal blood pressure below 130/80 and need for monitoring.  - Continue treatment plan as prescribed.  See med list. - Patient tolerating meds well without complication.  Denies S-E  - Ongoing regular ambulatory blood pressure monitoring encouraged. - Encouraged prudent health habits to help lower BP.  - Will continue to monitor.   Exercise Habits & Knee Pain - Encouraged patient to continue prudent exercise habits. - Advised patient to engage in lunges  and squats to help tone her thighs as desired. - Discussed proper form of lunges and squats with patient today.  - Given patient's chronic concern about knee over the past year, if sx continue to bother the patient, advised patient to come in and have her knee evaluated in person.  - Otherwise, continue to engage in prudent exercise habits.  - Will continue to monitor.   Vitamin D Deficiency - Need for re-check in near future. - Continue management on once weekly supplementation. - Patient tolerates meds well without S-E. - Will continue to monitor.   BMI Counseling - Obesity, Class III, BMI 40-49.9 (morbid obesity) Explained to patient what BMI refers to, and what it means medically.    Told patient to think about it as a "medical risk stratification measurement" and how increasing BMI is associated with increasing risk/ or worsening state of various diseases such as hypertension, hyperlipidemia, diabetes, premature OA, depression etc.  American Heart Association guidelines for healthy diet, basically Mediterranean diet, and exercise guidelines of 30 minutes 5 days per week or more discussed in detail.  Health counseling performed.  All questions answered.   Lifestyle & Preventative Health Maintenance - Advised patient to continue  working toward physical conditioning to improve overall mental, physical, and emotional health.    - Healthy dietary habits encouraged, including low-carb, and high amounts of lean protein in diet.   - Patient should also consume adequate amounts of water.   Recommendations - Last CPE was February of 2020. - Last chronic health maintenance visit was prior to February 2020.  - Need for blood work in near future.  - As part of my medical decision making, I reviewed the following data within the [place] History obtained from pt /family, CMA notes reviewed and incorporated if applicable, Labs reviewed, Radiograph/ tests reviewed if applicable and OV notes from prior OV's with me, as well as other specialists she/he has seen since seeing me last, were all reviewed and used in my medical decision making process today.   - Additionally, discussion had with patient regarding txmnt plan, their biases about that plan etc were used in my medical decision making today.   - The patient agreed with the plan and demonstrated an understanding of the instructions.   No barriers to understanding were identified.   - Red flag symptoms and signs discussed in detail.  Patient expressed understanding regarding what to do in case of emergency_0 urgent symptoms.  The patient was advised to call back or seek an in-person evaluation if the symptoms worsen or if the condition fails to improve as anticipated.   Return if symptoms worsen or fail to improve with knee or help with health goals:wt loss, for f/up 6mochronic care;    needs FBW near future.    Orders Placed This Encounter  Procedures  . Hemoglobin A1c  . VITAMIN D 25 Hydroxy (Vit-D Deficiency, Fractures)    Meds ordered this encounter  Medications  . metFORMIN (GLUCOPHAGE) 500 MG tablet    Sig: Take 1 tablet (500 mg total) by mouth 2 (two) times daily with a meal. Needs office visit for further refills    Dispense:  180 tablet     Refill:  0    Medications Discontinued During This Encounter  Medication Reason  . Cholecalciferol (VITAMIN D3) 5000 units TABS Discontinued by provider  . dexamethasone (DECADRON) 1 MG tablet Completed Course  . metFORMIN (GLUCOPHAGE) 500 MG tablet Reorder  I provided 23+ minutes of face-to-face time during this encounter,with over 50% of the time in direct counseling on patients medical conditions/ medical concerns.  Additional time was spent with charting and coordination of care after the actual visit commenced.   Note:  This note was prepared with assistance of Dragon voice recognition software. Occasional wrong-word or sound-a-like substitutions may have occurred due to the inherent limitations of voice recognition software.  This document serves as a record of services personally performed by Mellody Dance, DO. It was created on her behalf by Toni Amend, a trained medical scribe. The creation of this record is based on the scribe's personal observations and the provider's statements to them.  I have reviewed the above medical documentation for accuracy and completeness and I concur.  Mellody Dance, DO 04/12/2019 3:39 PM           Patient Care Team    Relationship Specialty Notifications Start End  Mellody Dance, DO PCP - General Family Medicine  03/20/17   Ob/Gyn, [facility]  10/29/18   Philemon Kingdom, MD Consulting Physician Endocrinology  04/09/19     -Vitals obtained; medications/ allergies reconciled;  personal medical, social, Sx etc.histories were updated by CMA, reviewed by me and are reflected in chart  Patient Active Problem List   Diagnosis Date Noted  . Nonalcoholic hepatosteatosis 22/29/7989  . h/o Hypertriglyceridemia 10/15/2018  . h/o Low serum HDL 10/15/2018  . Bladder spasms 07/31/2018  . chronic Irregular menstrual cycle-  has GYN 07/31/2018  . Neuralgia-  R upper ext- trapezius m spasm 04/24/2018   . Family history of leukemia- mom 04/24/2018  . Family history of malignant melanoma of skin- mom 04/24/2018  . Prediabetes 04/24/2018  . PCOS (polycystic ovarian syndrome) 03/19/2018  . Hirsutism 03/19/2018  . Acute chest pain 10/02/2017  . SOB (shortness of breath) 10/02/2017  . Painful breathing 10/02/2017  . Acute suppurative parotitis 04/10/2017  . Glucose intolerance (impaired glucose tolerance) 04/03/2017  . Vitamin D deficiency 04/03/2017  . HLD (hyperlipidemia) 04/03/2017  . HTN, goal below 130/80 03/20/2017  . History of chronic back pain- MRI done in past 03/20/2017  . Peripheral edema 03/20/2017  . Snoring- terrible 03/20/2017  . Obesity, Class III, BMI 40-49.9 (morbid obesity) ([place]) 03/20/2017  . Elevated liver enzymes 03/20/2017  . Myalgia 03/20/2017  . Arthralgia 03/20/2017  . BV (bacterial vaginosis) 03/22/2012  . Urinary catheter infection ([place]) 03/22/2012     Current Meds  Medication Sig  . albuterol (PROVENTIL HFA;VENTOLIN HFA) 108 (90 Base) MCG/ACT inhaler Inhale 2 puffs into the lungs every 6 (six) hours as needed for wheezing or shortness of breath.  Marland Kitchen aspirin (BAYER LOW DOSE) 81 MG EC tablet Take 81 mg by mouth daily. Swallow whole.  . hydrochlorothiazide (HYDRODIURIL) 12.5 MG tablet Take 1 tablet (12.5 mg total) by mouth daily. Needs office visit for further refills  . metFORMIN (GLUCOPHAGE) 500 MG tablet Take 1 tablet (500 mg total) by mouth 2 (two) times daily with a meal. Needs office visit for further refills  . Multiple Vitamin (MULTIVITAMIN) capsule Take 1 capsule by mouth daily.  Marland Kitchen oxybutynin (DITROPAN) 5 MG tablet TAKE 1 TABLET BY MOUTH THREE TIMES A DAY  . Vitamin D, Ergocalciferol, (DRISDOL) 50000 units CAPS capsule TAKE 1 CAPSULE (50,000 UNITS TOTAL) BY MOUTH EVERY 7 (SEVEN) DAYS.  . [DISCONTINUED] losartan (COZAAR) 100 MG tablet Take 0.5 tablets (50 mg total) by mouth daily.  . [DISCONTINUED] metFORMIN (GLUCOPHAGE) 500 MG tablet Take  1 tablet  (500 mg total) by mouth 2 (two) times daily with a meal. Needs office visit for further refills (Patient taking differently: Take 500 mg by mouth daily with breakfast. Needs office visit for further refills)  . [DISCONTINUED] pantoprazole (PROTONIX) 40 MG tablet Take 1 tablet (40 mg total) by mouth daily.     No Known Allergies   ROS:  See above HPI for pertinent positives and negatives   Objective:   Pulse 85, height 5' 2" (1.575 m), weight 228 lb 3.2 oz (103.5 kg).  (if some vitals are omitted, this means that patient was UNABLE to obtain them even though they were asked to get them prior to OV today.  They were asked to call us at their earliest convenience with these once obtained.)  General: A & O * 3; visually in no acute distress; in usual state of health.  Skin: Visible skin appears normal and pt's usual skin color HEENT:  EOMI, head is normocephalic and atraumatic.  Sclera are anicteric. Neck has a good range of motion.  Lips are noncyanotic Chest: normal chest excursion and movement Respiratory: speaking in full sentences, no conversational dyspnea; no use of accessory muscles Psych: insight good, mood- appears full

## 2019-04-12 ENCOUNTER — Other Ambulatory Visit: Payer: Self-pay | Admitting: Family Medicine

## 2019-04-14 ENCOUNTER — Other Ambulatory Visit: Payer: Self-pay | Admitting: Family Medicine

## 2019-04-27 ENCOUNTER — Other Ambulatory Visit: Payer: Self-pay | Admitting: Family Medicine

## 2019-04-27 DIAGNOSIS — E559 Vitamin D deficiency, unspecified: Secondary | ICD-10-CM

## 2019-05-08 ENCOUNTER — Other Ambulatory Visit: Payer: Self-pay | Admitting: Family Medicine

## 2019-05-08 DIAGNOSIS — I10 Essential (primary) hypertension: Secondary | ICD-10-CM

## 2019-05-20 ENCOUNTER — Other Ambulatory Visit: Payer: Self-pay | Admitting: Family Medicine

## 2019-05-20 DIAGNOSIS — E559 Vitamin D deficiency, unspecified: Secondary | ICD-10-CM

## 2019-07-03 ENCOUNTER — Telehealth: Payer: Self-pay | Admitting: Family Medicine

## 2019-07-03 ENCOUNTER — Encounter: Payer: Self-pay | Admitting: Family Medicine

## 2019-07-03 NOTE — Telephone Encounter (Signed)
Patient called states she experienced same rash a while back , went to Urgent Care & was placed on Prednisone (rash went away) but came back after treatment completed-- she says will take pics & upload them on Mychart for provider to view--- Scheduled pt for Thurs, Nov 5 / Telehealth Appt.  --Also forwarding message to medical asst to see as fyi.  -glh

## 2019-07-04 NOTE — Telephone Encounter (Signed)
Please make sure you tell patient this needs to be a Doxy follow-up appointment.  Also without a history as to when the rash came on, what her symptoms are, where it is etc. Etc. I cannot make further recommendations regarding treatment.   If this is bad I recommend she go to urgent care immediately if she cannot wait until Thursday of next week

## 2019-07-04 NOTE — Telephone Encounter (Signed)
Patient states that she can wait until her DOXY apt Thursday. AS, CMA

## 2019-07-04 NOTE — Telephone Encounter (Signed)
FYI

## 2019-07-10 ENCOUNTER — Other Ambulatory Visit: Payer: Self-pay

## 2019-07-10 ENCOUNTER — Ambulatory Visit (INDEPENDENT_AMBULATORY_CARE_PROVIDER_SITE_OTHER): Payer: No Typology Code available for payment source | Admitting: Family Medicine

## 2019-07-10 ENCOUNTER — Encounter: Payer: Self-pay | Admitting: Family Medicine

## 2019-07-10 DIAGNOSIS — L259 Unspecified contact dermatitis, unspecified cause: Secondary | ICD-10-CM

## 2019-07-10 MED ORDER — TRIAMCINOLONE ACETONIDE 0.5 % EX CREA: 1.0000 "application " | TOPICAL_CREAM | Freq: Three times a day (TID) | CUTANEOUS | 0 refills | Status: DC

## 2019-07-10 MED ORDER — LEVOCETIRIZINE DIHYDROCHLORIDE 5 MG PO TABS: 5.0000 mg | ORAL_TABLET | Freq: Every evening | ORAL | 1 refills | Status: DC

## 2019-07-10 MED ORDER — FAMOTIDINE 20 MG PO TABS: 20.0000 mg | ORAL_TABLET | Freq: Two times a day (BID) | ORAL | 1 refills | Status: DC

## 2019-07-10 NOTE — Progress Notes (Signed)
Virtual / live video office visit note for [employer], D.O- Primary Care Physician at [facility]   I connected with current patient today and beyond visually recognizing the correct individual, I verified that I am speaking with the correct person using two identifiers.  . Location of the patient: Home . Location of the provider: Office Only the patient (+/- their family members at pt's discretion) and myself were participating in the encounter    - This visit type was conducted due to national recommendations for restrictions regarding the COVID-19 Pandemic (e.g. social distancing) in an effort to limit this patient's exposure and mitigate transmission in our community.  This format is felt to be most appropriate for this patient at this time.   - The patient did have access to video technology today  - No physical exam could be performed with this format, beyond that communicated to [place] by the patient/ family members as noted.   - Additionally my office staff/ schedulers discussed with the patient that there may be a monetary charge related to this service, depending on patient's medical insurance.   The patient expressed understanding, and agreed to proceed.       History of Present Illness:  I, [name], am serving as scribe for Dr. [name].  Notes "doing okay, just stressing out a little bit."  Overall feels she's handling the stress okay with her coping strategies etc.  Notes she is walking "almost every day" and trying to exercise regularly "for health reasons."  Overall, denies concerns about her emotional state.  Says "I've been stressed but no more than usual."  - Skin Rash Has "this crazy rash."  Red dot rashes everywhere.  Thought they were mosquito bites at first and was putting neosporin on them.  Notes the rash is located in several places, including her leg, "four different places on my right leg."  Notes the worst of it is on her stomach.   Itches all the time; "I have to put cream on it all the time."  Notes the rash is no worse in the morning vs night, sx bother her "all the time."    Denies known exposures.  She has not noticed a connection to food she's eating or other new habits, or any other exposures such as soap etc.  Denies new pets in the house.  Denies anyone else having a rash in the house.  Notes it started "probably about a month and a week ago."  Called into the office here; notes they were closed as it was a Friday, and she was told to go to Urgent Care if the rash got worse.  Says she didn't go to Urgent Care that Friday or Saturday, but on Sunday she woke up and noticed the rash had "multiplied."  She only got worried about the rash once it started spreading.  She went to Urgent Care at that time.  Patient was told there that she was having an allergic reaction to something; says she hadn't changed anything aside from her shampoo, "but the rash wasn't on my head or anything" she expected.  Urgent Care called in prednisone and put her on 60 mg of prednisone per day, for five days.  Says "they didn't do the stair step," and after she took the prednisone, the rash went away.  However, two days after she stopped taking steroids, "the spots started coming back."       Depression screen [facility] 2/9  07/10/2019 10/15/2018 07/31/2018 05/23/2018 04/24/2018  Decreased Interest 0 0 0 0 0  Down, Depressed, Hopeless 0 0 0 0 0  PHQ - 2 Score 0 0 0 0 0  Altered sleeping 0 0 0 3 0  Tired, decreased energy 0 0 0 2 1  Change in appetite 1 0 0 1 1  Feeling bad or failure about yourself  0 0 0 0 0  Trouble concentrating 0 0 0 0 0  Moving slowly or fidgety/restless 0 0 0 0 0  Suicidal thoughts 0 0 0 - 0  PHQ-9 Score 1 0 0 6 2  Difficult doing work/chores Not difficult at all Not difficult at all Not difficult at all Somewhat difficult Not difficult at all  Some recent data might be hidden    No flowsheet data found.   Impression  and Recommendations:    1. Contact dermatitis, unspecified contact dermatitis type, unspecified trigger     Contact Dermatitis, Unknown Etiology - Extensively reviewed patient's symptoms during appointment today. - Education provided and all questions answered.  - Advised patient to begin non-sedating antihistamine daily, such as Zyrtec or Allegra. - Prescription provided today.  See med list.  - Advised beginning H2 blocker such as ranitidine, famotidine. - Prescription provided today.  See med list.  - Triamcinolone cream prescribed today.  See med list.   - Advised patient to use on arms, legs, hands, NOT face, genital area or near sensitive skin.  - Discussed that treatment will reduce patient's symptoms, but will not eliminate them. - Advised patient to watch for triggers that worsen her rash, such as foods, soaps, animals. - Epxlained that the only way to stop an allergic reaction is to discontinue exposure to allergens.  - Discussed that rash may also be emotionally mediated. - Encouraged patient to continue walking daily.  Patient knows to engage in prudent health habits such as exercise and meditation to cope with and control stress.  - If symptoms do not resolve, patient agrees to referral to allergist near future.  See orders. - Patient will call front desk on Monday if she has not heard about ambulatory referral by then.  - Additionally, discussion had with patient regarding txmnt plan, their biases about that plan etc were used in my medical decision making today.   - The patient agreed with the plan and demonstrated an understanding of the instructions.   No barriers to understanding were identified.    - The patient was advised to call back or seek an in-person evaluation if the symptoms worsen or if the condition fails to improve as anticipated.   Return for F-up of current med issues as previously d/c pt and prn if NI/W.    Orders Placed This Encounter   Procedures  . Ambulatory referral to Allergy    Meds ordered this encounter  Medications  . levocetirizine (XYZAL) 5 MG tablet    Sig: Take 1 tablet (5 mg total) by mouth every evening.    Dispense:  90 tablet    Refill:  1  . famotidine (PEPCID) 20 MG tablet    Sig: Take 1 tablet (20 mg total) by mouth 2 (two) times daily.    Dispense:  180 tablet    Refill:  1  . triamcinolone cream (KENALOG) 0.5 %    Sig: Apply 1 application topically 3 (three) times daily. To affected areas.    Dispense:  80 g    Refill:  0    Medications Discontinued  During This Encounter  Medication Reason  . oxybutynin (DITROPAN) 5 MG tablet Error  . Vitamin D, Ergocalciferol, (DRISDOL) 50000 units CAPS capsule Error      Note:  This note was prepared with assistance of Dragon voice recognition software. Occasional wrong-word or sound-a-like substitutions may have occurred due to the inherent limitations of voice recognition software.   This document serves as a record of services personally performed by Mellody Dance, DO. It was created on her behalf by Toni Amend, a trained medical scribe. The creation of this record is based on the scribe's personal observations and the provider's statements to them.   This case required medical decision making of at least moderate complexity. The above documentation has been reviewed to be accurate and was completed by Marjory Sneddon, D.O.      Patient Care Team    Relationship Specialty Notifications Start End  Mellody Dance, DO PCP - General Family Medicine  03/20/17   Ob/Gyn, [facility] and Gynecology  10/29/18   Philemon Kingdom, MD Consulting Physician Endocrinology  04/09/19     -Vitals obtained; medications/ allergies reconciled;  personal medical, social, Sx etc.histories were updated by CMA, reviewed by me and are reflected in chart  Patient Active Problem List   Diagnosis Date Noted  . Nonalcoholic hepatosteatosis  10/15/2018    Priority: High  . h/o Hypertriglyceridemia 10/15/2018    Priority: High  . h/o Low serum HDL 10/15/2018    Priority: High  . Glucose intolerance (impaired glucose tolerance) 04/03/2017    Priority: High  . HLD (hyperlipidemia) 04/03/2017    Priority: High  . HTN, goal below 130/80 03/20/2017    Priority: High  . chronic Irregular menstrual cycle-  has GYN 07/31/2018    Priority: Medium  . PCOS (polycystic ovarian syndrome) 03/19/2018    Priority: Medium  . Snoring- terrible 03/20/2017    Priority: Medium  . Obesity, Class III, BMI 40-49.9 (morbid obesity) ([place]) 03/20/2017    Priority: Medium  . Elevated liver enzymes 03/20/2017    Priority: Medium  . Neuralgia-  R upper ext- trapezius m spasm 04/24/2018    Priority: Low  . Vitamin D deficiency 04/03/2017    Priority: Low  . Arthralgia 03/20/2017    Priority: Low  . Bladder spasms 07/31/2018  . Family history of leukemia- mom 04/24/2018  . Family history of malignant melanoma of skin- mom 04/24/2018  . Prediabetes 04/24/2018  . Hirsutism 03/19/2018  . Acute chest pain 10/02/2017  . SOB (shortness of breath) 10/02/2017  . Painful breathing 10/02/2017  . Acute suppurative parotitis 04/10/2017  . History of chronic back pain- MRI done in past 03/20/2017  . Peripheral edema 03/20/2017  . Myalgia 03/20/2017  . BV (bacterial vaginosis) 03/22/2012  . Urinary catheter infection ([place]) 03/22/2012     Current Meds  Medication Sig  . albuterol (PROVENTIL HFA;VENTOLIN HFA) 108 (90 Base) MCG/ACT inhaler Inhale 2 puffs into the lungs every 6 (six) hours as needed for wheezing or shortness of breath.  Marland Kitchen aspirin (BAYER LOW DOSE) 81 MG EC tablet Take 81 mg by mouth daily. Swallow whole.  . cholecalciferol (VITAMIN D3) 25 MCG (1000 UT) tablet Take 1,000 Units by mouth daily.  . hydrochlorothiazide (HYDRODIURIL) 12.5 MG tablet TAKE 1 TABLET BY MOUTH EVERY DAY  . losartan (COZAAR) 100 MG tablet Take 0.5 tablets (50 mg  total) by mouth daily.  . metFORMIN (GLUCOPHAGE) 500 MG tablet Take 1 tablet (500 mg total) by mouth 2 (two)  times daily with a meal. Needs office visit for further refills (Patient taking differently: Take 500 mg by mouth daily. Needs office visit for further refills)  . Multiple Vitamin (MULTIVITAMIN) capsule Take 1 capsule by mouth daily.  . pantoprazole (PROTONIX) 40 MG tablet TAKE 1 TABLET BY MOUTH EVERY DAY     No Known Allergies   ROS:  See above HPI for pertinent positives and negatives   Objective:   Last menstrual period 06/09/2019.  (if some vitals are omitted, this means that patient was UNABLE to obtain them even though they were asked to get them prior to OV today.  They were asked to call us at their earliest convenience with these once obtained.)  General: A & O * 3; visually in no acute distress; in usual state of health.  Skin: Visible skin appears normal and pt's usual skin color. Solitary raised skin wheels- 3 on R lower leg, One on each arm and group of smaller raised papules/ wheels on anterior abdomen- + erythema and no pustules.  HEENT:  EOMI, head is normocephalic and atraumatic.  Sclera are anicteric. Neck has a good range of motion.  Lips are noncyanotic Chest: normal chest excursion and movement Respiratory: speaking in full sentences, no conversational dyspnea; no use of accessory muscles Psych: insight good, mood- appears full

## 2019-07-14 ENCOUNTER — Telehealth: Payer: Self-pay | Admitting: Family Medicine

## 2019-07-14 ENCOUNTER — Encounter: Payer: Self-pay | Admitting: Family Medicine

## 2019-07-14 DIAGNOSIS — L259 Unspecified contact dermatitis, unspecified cause: Secondary | ICD-10-CM

## 2019-07-14 NOTE — Telephone Encounter (Signed)
Patient sent mychart message requesting information on Pt. Accting dept to pay a bill--- Called pt back gave her the billing office phone#.  --glh

## 2019-07-15 NOTE — Telephone Encounter (Signed)
Opened in error

## 2019-07-15 NOTE — Telephone Encounter (Signed)
Vickie Dance, DO  Vickie [name], Vickie [name], CMA        Please call patient and let her know thank you for letting us know. Tell her I am sorry that she is breaking out more.   Please call patient and see if she would also like to be referred to dermatology as well. If she agrees to this, please place referral with the rash diagnosis used in her last OV.   Also, please let her know that I recommend she call and asked to be put on a waiting list for cancellations. If they do not have the sort of thing, then please have her ask Dorothea Ogle if there is another allergy clinic we can send her to that might be able to get her in to be seen quicker.   Again tell her my apologies that she is experiencing this and I wish my magic wand would make it all go away.   Previous Messages     Spoke with patient and she said she would contact allergist but would like me to place dermatology referral. Referral placed. AS, CMA

## 2019-08-01 ENCOUNTER — Other Ambulatory Visit: Payer: Self-pay | Admitting: Family Medicine

## 2019-08-01 DIAGNOSIS — I10 Essential (primary) hypertension: Secondary | ICD-10-CM

## 2019-08-01 DIAGNOSIS — E282 Polycystic ovarian syndrome: Secondary | ICD-10-CM

## 2019-08-01 DIAGNOSIS — R7303 Prediabetes: Secondary | ICD-10-CM

## 2019-08-04 ENCOUNTER — Ambulatory Visit: Payer: Self-pay | Admitting: Allergy

## 2019-08-04 ENCOUNTER — Encounter: Payer: Self-pay | Admitting: Family Medicine

## 2019-08-07 ENCOUNTER — Encounter: Payer: Self-pay | Admitting: Internal Medicine

## 2019-08-10 ENCOUNTER — Other Ambulatory Visit: Payer: Self-pay | Admitting: Family Medicine

## 2019-08-26 ENCOUNTER — Encounter: Payer: Self-pay | Admitting: Family Medicine

## 2019-09-08 ENCOUNTER — Encounter: Payer: Self-pay | Admitting: Family Medicine

## 2019-09-08 ENCOUNTER — Ambulatory Visit (INDEPENDENT_AMBULATORY_CARE_PROVIDER_SITE_OTHER): Payer: No Typology Code available for payment source | Admitting: Family Medicine

## 2019-09-08 ENCOUNTER — Other Ambulatory Visit: Payer: Self-pay

## 2019-09-08 VITALS — Ht 62.0 in | Wt 241.6 lb

## 2019-09-08 DIAGNOSIS — B354 Tinea corporis: Secondary | ICD-10-CM | POA: Diagnosis not present

## 2019-09-08 DIAGNOSIS — R21 Rash and other nonspecific skin eruption: Secondary | ICD-10-CM | POA: Diagnosis not present

## 2019-09-08 MED ORDER — TERBINAFINE HCL POWD: 1 refills | Status: DC

## 2019-09-08 MED ORDER — FLUCONAZOLE 200 MG PO TABS: 200.0000 mg | ORAL_TABLET | ORAL | 0 refills | Status: DC

## 2019-09-08 MED ORDER — NAFTIFINE HCL 2 % EX GEL: CUTANEOUS | 0 refills | Status: DC

## 2019-09-08 NOTE — Progress Notes (Signed)
Virtual / live video office visit note for [employer], D.O- Primary Care Physician at [facility]   I connected with current patient today and beyond visually recognizing the correct individual, I verified that I am speaking with the correct person using two identifiers.  . Location of the patient: Home . Location of the provider: Office Only the patient (+/- their family members at pt's discretion) and myself were participating in the encounter    - This visit type was conducted due to national recommendations for restrictions regarding the COVID-19 Pandemic (e.g. social distancing) in an effort to limit this patient's exposure and mitigate transmission in our community.  This format is felt to be most appropriate for this patient at this time.   - The patient did have access to video technology today  - No physical exam could be performed with this format, beyond that communicated to [place] by the patient/ family members as noted.   - Additionally my office staff/ schedulers discussed with the patient that there may be a monetary charge related to this service, depending on patient's medical insurance.   The patient expressed understanding, and agreed to proceed.      History of Present Illness: Rash   I, Toni Amend, am serving as scribe for Dr. Mellody Dance.  - Skin Rash Notes she "seems to be breaking out with stuff."  ---> Personal medical picture sent via patient message on 08/26/2019.  This was reviewed today as well and further discussion was had of the image with patient today.  New rash-different than prior - states it's not on her legs, but instead on her privates.   Describes the area in question as underneath her stomach, above her pubic area.  Notes this is like many "mosquito bite on my privates."  Says that the rash is now a little bit down in the groin area, but mostly in the suprapubic area.  Denies burning; "just itchy."  She has tried cortisone on  it, "which did not seem to help at all."  Says "it itches sometimes, not all the time; it doesn't itch so bad it drives me crazy, but sometimes it itches."  Notes these symptoms have been going on for about three weeks.  Notes her husband said "it looks like a bunch of tiny little blisters."  Says she's never had this rash before.  "I thought it was heat rash, so I put some powder on it, and it's definitely not heat rash."  Notes she's been trying to avoid scratching the area.  - Chronic Concerns Notes she continues having neck issues and a knot on her leg, which she wants to have assessed at a future OV.   Depression screen [facility] 2/9 09/08/2019 07/10/2019 10/15/2018 07/31/2018 05/23/2018  Decreased Interest 0 0 0 0 0  Down, Depressed, Hopeless 0 0 0 0 0  PHQ - 2 Score 0 0 0 0 0  Altered sleeping 0 0 0 0 3  Tired, decreased energy 0 0 0 0 2  Change in appetite 0 1 0 0 1  Feeling bad or failure about yourself  0 0 0 0 0  Trouble concentrating 0 0 0 0 0  Moving slowly or fidgety/restless 0 0 0 0 0  Suicidal thoughts 0 0 0 0 -  PHQ-9 Score 0 1 0 0 6  Difficult doing work/chores - Not difficult at all Not difficult at all Not difficult at all Somewhat difficult  Some recent data  might be hidden     Impression and Recommendations:    1. Tinea corporis     Tinea Corporis - Educated patient today regarding symptoms.  All questions answered.  - Discussed that patient's symptoms resemble yeast infection, which tends to occur in warm, moist, dark areas where the skin folds over itself and causes compression.  - Reviewed need to leave the area open to air, dry, and exposed to light.  - Discontinue use of cortisone cream. - Antifungal pill (diflucan) prescribed today. - Advised patient to use antifungal cream/gel and powder as prescribed.  - Encouraged patient to avoid scratching or abrading the area.  - Discussed use of corn starch or specialized powders for yeast to help keep the area  dry. - Emphasized importance of keeping the area as clean and dry as possible.  - Will continue to monitor.  Recommendations - Patient will return in near future for concerns as described.   - Additionally, discussion had with patient regarding txmnt plan, their biases about that plan etc were used in my medical decision making today.   - The patient agreed with the plan and demonstrated an understanding of the instructions.   No barriers to understanding were identified.    Return for in-person OV for neck issues and knot on leg; patient will call for OV.    Meds ordered this encounter  Medications  . fluconazole (DIFLUCAN) 200 MG tablet    Sig: Take 1 tablet (200 mg total) by mouth once a week.    Dispense:  4 tablet    Refill:  0  . Terbinafine HCl POWD    Sig: Apply to affected area up to 3 times daily    Dispense:  100 g    Refill:  1  . Naftifine HCl 2 % GEL    Sig: Apply a thin layer once daily to affected area and healthy surrounding skin (1/2 inch margin) for 2 weeks    Dispense:  60 g    Refill:  0    Medications Discontinued During This Encounter  Medication Reason  . triamcinolone cream (KENALOG) 0.5 % Error     Note:  This note was prepared with assistance of Dragon voice recognition software. Occasional wrong-word or sound-a-like substitutions may have occurred due to the inherent limitations of voice recognition software.  This document serves as a record of services personally performed by [name], DO. It was created on her behalf by [name], a trained medical scribe. The creation of this record is based on the scribe's personal observations and the provider's statements to them.   This case required medical decision making of at least moderate complexity. The above documentation has been reviewed to be accurate and was completed by Carlye Grippe, D.O.        Patient Care Team    Relationship Specialty Notifications Start End    [name], DO PCP - General Family Medicine  03/20/17   Ob/Gyn, [facility] and Gynecology  10/29/18   Carlus Pavlov, MD Consulting Physician Endocrinology  04/09/19     -Vitals obtained; medications/ allergies reconciled;  personal medical, social, Sx etc.histories were updated by CMA, reviewed by me and are reflected in chart  Patient Active Problem List   Diagnosis Date Noted  . Nonalcoholic hepatosteatosis 10/15/2018  . h/o Hypertriglyceridemia 10/15/2018  . h/o Low serum HDL 10/15/2018  . Glucose intolerance (impaired glucose tolerance) 04/03/2017  . HLD (hyperlipidemia) 04/03/2017  . HTN, goal below  130/80 03/20/2017  . chronic Irregular menstrual cycle-  has GYN 07/31/2018  . PCOS (polycystic ovarian syndrome) 03/19/2018  . Snoring- terrible 03/20/2017  . Obesity, Class III, BMI 40-49.9 (morbid obesity) ([facility]) 03/20/2017  . Elevated liver enzymes 03/20/2017  . Neuralgia-  R upper ext- trapezius m spasm 04/24/2018  . Vitamin D deficiency 04/03/2017  . Arthralgia 03/20/2017  . Bladder spasms 07/31/2018  . Family history of leukemia- mom 04/24/2018  . Family history of malignant melanoma of skin- mom 04/24/2018  . Prediabetes 04/24/2018  . Hirsutism 03/19/2018  . Acute chest pain 10/02/2017  . SOB (shortness of breath) 10/02/2017  . Painful breathing 10/02/2017  . Acute suppurative parotitis 04/10/2017  . History of chronic back pain- MRI done in past 03/20/2017  . Peripheral edema 03/20/2017  . Myalgia 03/20/2017  . BV (bacterial vaginosis) 03/22/2012  . Urinary catheter infection ([facility]) 03/22/2012     Current Meds  Medication Sig  . aspirin (BAYER LOW DOSE) 81 MG EC tablet Take 81 mg by mouth daily. Swallow whole.  . cholecalciferol (VITAMIN D3) 25 MCG (1000 UT) tablet Take 1,000 Units by mouth daily.  . famotidine (PEPCID) 20 MG tablet Take 1 tablet (20 mg total) by mouth 2 (two) times daily.  . hydrochlorothiazide (HYDRODIURIL) 12.5 MG  tablet TAKE 1 TABLET BY MOUTH EVERY DAY  . levocetirizine (XYZAL) 5 MG tablet Take 1 tablet (5 mg total) by mouth every evening.  Marland Kitchen losartan (COZAAR) 100 MG tablet Take 0.5 tablets (50 mg total) by mouth daily.  . metFORMIN (GLUCOPHAGE) 500 MG tablet Take 1 tablet by mouth 2 times daily with meal (Patient taking differently: Take 1 tablet by mouth 2 times daily with meal   Patient states shes only taking 1 times daily)  . Multiple Vitamin (MULTIVITAMIN) capsule Take 1 capsule by mouth daily.  . pantoprazole (PROTONIX) 40 MG tablet TAKE 1 TABLET BY MOUTH EVERY DAY     No Known Allergies   ROS:  See above HPI for pertinent positives and negatives   Objective:   Height 5\' 2"  (1.575 m), weight 241 lb 9.6 oz (109.6 kg), last menstrual period 09/01/2019.  (if some vitals are omitted, this means that patient was UNABLE to obtain them even though they were asked to get them prior to OV today.  They were asked to call 09/03/2019 at their earliest convenience with these once obtained.)  General: A & O * 3; visually in no acute distress; in usual state of health.  Skin: Visible skin appears normal and pt's usual skin color HEENT:  EOMI, head is normocephalic and atraumatic.  Sclera are anicteric. Neck has a good range of motion.  Lips are noncyanotic Chest: normal chest excursion and movement Respiratory: speaking in full sentences, no conversational dyspnea; no use of accessory muscles Psych: insight good, mood- appears full

## 2019-09-10 ENCOUNTER — Encounter: Payer: Self-pay | Admitting: Family Medicine

## 2019-11-04 ENCOUNTER — Other Ambulatory Visit: Payer: Self-pay | Admitting: Family Medicine

## 2019-11-04 DIAGNOSIS — R7303 Prediabetes: Secondary | ICD-10-CM

## 2019-11-04 DIAGNOSIS — E282 Polycystic ovarian syndrome: Secondary | ICD-10-CM

## 2019-11-10 ENCOUNTER — Ambulatory Visit (INDEPENDENT_AMBULATORY_CARE_PROVIDER_SITE_OTHER)
Admission: RE | Admit: 2019-11-10 | Discharge: 2019-11-10 | Disposition: A | Discharge status: Home / Self Care | Payer: No Typology Code available for payment source | Source: Ambulatory Visit

## 2019-11-10 ENCOUNTER — Telehealth: Payer: Self-pay | Admitting: Family Medicine

## 2019-11-10 DIAGNOSIS — M542 Cervicalgia: Secondary | ICD-10-CM

## 2019-11-10 MED ORDER — NAPROXEN 500 MG PO TABS: 500.0000 mg | ORAL_TABLET | Freq: Two times a day (BID) | ORAL | 0 refills | Status: DC
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
## 2019-11-10 NOTE — Discharge Instructions (Addendum)
Recommend RICE: rest, ice, compression, elevation as needed for pain.    Heat therapy (hot compress, warm wash red, hot showers, etc.) can help relax muscles and soothe muscle aches.  For pain: take naproxen as directed.  May take tylenol if needed.   Return for worsening pain

## 2019-11-10 NOTE — Telephone Encounter (Signed)
Patient states the EMS came to her house and evaluated her and did ECG which she states her vitals and ECG were normal.  Patient calling office back requesting apt.  States that both sides of her neck air hurting with pain/tingling radiating down her arms. Patient is requesting medication recommendations.   I advised patient that since we do not have an available apt until 3/29/21per front desk and due to having no provider in office this morning-then she still needs to be evaluated by a physician. Patient states she does not want to go to ED. I advised patient to seek care at Urgent care for evaluation of current symptoms. Patient verbalized understanding and stated that she would. AS, CMA

## 2019-11-10 NOTE — Telephone Encounter (Signed)
Patient called office this morning stating that she was having chest pain near sternum/left sided that went thru to her back. Also complaining of pain radiating down her Left arm with some SOB. I advised patient to call 911 and go to ED for evaluation. Patient verbalized understanding and was calling 911. AS, CMA

## 2019-11-11 ENCOUNTER — Encounter: Payer: Self-pay | Admitting: Family Medicine

## 2019-11-11 DIAGNOSIS — M542 Cervicalgia: Secondary | ICD-10-CM

## 2019-11-12 ENCOUNTER — Other Ambulatory Visit: Payer: Self-pay

## 2019-11-12 ENCOUNTER — Ambulatory Visit: Payer: No Typology Code available for payment source | Admitting: Neurology

## 2019-11-12 ENCOUNTER — Encounter: Payer: Self-pay | Admitting: Neurology

## 2019-11-12 VITALS — BP 142/86 | HR 88 | Temp 97.5°F | Ht 62.0 in | Wt 249.0 lb

## 2019-11-12 DIAGNOSIS — R202 Paresthesia of skin: Secondary | ICD-10-CM | POA: Diagnosis not present

## 2019-11-12 DIAGNOSIS — M542 Cervicalgia: Secondary | ICD-10-CM | POA: Diagnosis not present

## 2019-11-12 DIAGNOSIS — R200 Anesthesia of skin: Secondary | ICD-10-CM

## 2019-11-12 DIAGNOSIS — M5412 Radiculopathy, cervical region: Secondary | ICD-10-CM

## 2019-11-12 NOTE — Patient Instructions (Signed)
Your neurological exam is normal thankfully, you have stable numbness in the left hand, nevertheless, we can investigate your neck pain and numbness and tingling affecting both upper extremities with a cervical spine MRI which is a neck MRI, I would like to do this with and without contrast.  We will do blood work today to make sure your kidney function is okay. We will do an EMG and nerve conduction velocity test, which is an electrical nerve and muscle test, which we will schedule. We will call you with the results. You have an appointment coming up with your primary care physician.  Please talk to her about a referral to see a spine specialist.  It may take a couple of weeks for [place] to get an authorization for a neck MRI. You may be at risk for obstructive sleep apnea as you report snoring and you have a larger neck circumference, elevated body mass index and a crowded looking airway.  Please consider doing a sleep study, I would be happy to evaluate you for obstructive sleep apnea.You can also talk to your primary care physician at the next appointment about this.

## 2019-11-12 NOTE — Progress Notes (Signed)
Subjective:    Patient ID: Vickie [name] is a 39 y.o. female.  HPI     Huston Foley, MD, PhD [facility] [address], [address] [address] [place], [place] [address]  Dear Dr. Sharee Holster: I saw your patient, Vickie [name], upon your kind request of my neurologic clinic today for initial consultation of her neck pain.  The patient is unaccompanied today.  As you know, Ms. Cortes-Altamirano is a 39 year old right-handed woman with an underlying medical history of hypertension, depression, anxiety, reflux disease, history of left arm fracture with status post surgery and hardware placement, and morbid obesity with a BMI of over 40, who reports chronic neck pain for the past 2+ years, intermittently radiating to both arms.  She had a telemedicine urgent care visit on 11/10/2019 and was advised to use nonsteroidal anti-inflammatory medication, hot compresses and stretching exercises. She reports that her left-sided symptoms are worse than the right.  She has intermittent numbness and tingling, feels weak in the grip strength at times.  Symptoms flareup from time to time.  She has not seen a spine specialist before.  She has been trying naproxen.  She has used a heat pad which helps to some degree.  She denies any constant numbness with the exception of numbness in the small area of the left hand, this has not changed, she reports that she had nerve damage when she had her fractured humerus.  She has a rod in place.  This was several years ago.  She has never had a neck MRI.  She has not fallen.  She denies any weakness on one side of her body.  Pain starts in the upper neck area and radiates to both shoulders typically.  She denies any facial symptoms, no blurry vision, no recurrent headaches, no double vision or loss of vision.  Her Past Medical History Is Significant For: Past Medical History:  Diagnosis Date  . Abnormal Pap smear   . Anxiety   . Broken arm     Left   . Complication of anesthesia    Epidural- baby's HR decreased ; developed  fever  . Fatigue 2008  . H/O dyspareunia 01/23/11  . H/O varicella   . Hx of candidiasis   . Hx: UTI (urinary tract infection)   . Hypertension   . Increased BMI   . Irregular periods/menstrual cycles 2008  . Postpartum depression   . Vaginal lesion 2009    Painful  . Vaginal pain 01/23/11    Her Past Surgical History Is Significant For: Past Surgical History:  Procedure Laterality Date  . repair broken arm    . TONSILLECTOMY     Age 28  . TUBAL LIGATION    . WISDOM TOOTH EXTRACTION      Her Family History Is Significant For: Family History  Problem Relation Age of Onset  . Hypertension Mother   . Diabetes Mother   . Cancer Mother        skin  . Leukemia Mother   . Diabetes Father   . Hypertension Father   . COPD Father   . Stroke Maternal Grandmother     Her Social History Is Significant For: Social History   Socioeconomic History  . Marital status: Married    Spouse name: Not on file  . Number of children: Not on file  . Years of education: Not on file  . Highest education level: Not on file  Occupational History  . Not on file  Tobacco Use  . Smoking status: Never Smoker  . Smokeless tobacco: Never Used  Substance and Sexual Activity  . Alcohol use: Yes    Alcohol/week: 1.0 standard drinks    Types: 1 Standard drinks or equivalent per week    Comment: social  . Drug use: No  . Sexual activity: Never    Partners: Male    Comment: BTL  Other Topics Concern  . Not on file  Social History Narrative  . Not on file   Social Determinants of Health   Financial Resource Strain:   . Difficulty of Paying Living Expenses: Not on file  Food Insecurity:   . Worried About Charity fundraiser in the Last Year: Not on file  . Ran Out of Food in the Last Year: Not on file  Transportation Needs:   . Lack of Transportation (Medical): Not on file  . Lack of Transportation  (Non-Medical): Not on file  Physical Activity:   . Days of Exercise per Week: Not on file  . Minutes of Exercise per Session: Not on file  Stress:   . Feeling of Stress : Not on file  Social Connections:   . Frequency of Communication with Friends and Family: Not on file  . Frequency of Social Gatherings with Friends and Family: Not on file  . Attends Religious Services: Not on file  . Active Member of Clubs or Organizations: Not on file  . Attends Archivist Meetings: Not on file  . Marital Status: Not on file    Her Allergies Are:  No Known Allergies:   Her Current Medications Are:  Outpatient Encounter Medications as of 11/12/2019  Medication Sig  . Acetaminophen (TYLENOL PO) Take by mouth. PRN  . aspirin (BAYER LOW DOSE) 81 MG EC tablet Take 81 mg by mouth daily. Swallow whole.  . famotidine (PEPCID) 20 MG tablet Take 1 tablet (20 mg total) by mouth 2 (two) times daily.  . fluconazole (DIFLUCAN) 200 MG tablet Take 1 tablet (200 mg total) by mouth once a week.  . hydrochlorothiazide (HYDRODIURIL) 12.5 MG tablet TAKE 1 TABLET BY MOUTH EVERY DAY  . levocetirizine (XYZAL) 5 MG tablet Take 1 tablet (5 mg total) by mouth every evening.  Marland Kitchen losartan (COZAAR) 100 MG tablet Take 0.5 tablets (50 mg total) by mouth daily.  . metFORMIN (GLUCOPHAGE) 500 MG tablet TAKE 1 TABLET BY MOUTH 2 TIMES DAILY WITH MEAL  . Multiple Vitamin (MULTIVITAMIN) capsule Take 1 capsule by mouth daily.  . naproxen (NAPROSYN) 500 MG tablet Take 1 tablet (500 mg total) by mouth 2 (two) times daily.  . pantoprazole (PROTONIX) 40 MG tablet TAKE 1 TABLET BY MOUTH EVERY DAY  . albuterol (PROVENTIL HFA;VENTOLIN HFA) 108 (90 Base) MCG/ACT inhaler Inhale 2 puffs into the lungs every 6 (six) hours as needed for wheezing or shortness of breath.  . cholecalciferol (VITAMIN D3) 25 MCG (1000 UT) tablet Take 1,000 Units by mouth daily.  . Naftifine HCl 2 % GEL Apply a thin layer once daily to affected area and healthy  surrounding skin (1/2 inch margin) for 2 weeks  . Terbinafine HCl POWD Apply to affected area up to 3 times daily   No facility-administered encounter medications on file as of 11/12/2019.  :   Review of Systems:  Out of a complete 14 point review of systems, all are reviewed and negative with the exception of these symptoms as listed below:  Review of Systems  Neurological:  Here for consult on neck/shoulder pain has been present for 2+ years. Sx have increased as of late and typically will take tylenol and Naproxen PO PRN to help. Reports meds dull the pain but do not take the pain away.     Objective:  Neurological Exam  Physical Exam Physical Examination:   Vitals:   11/12/19 1005  BP: (!) 142/86  Pulse: 88  Temp: (!) 97.5 F (36.4 C)   General Examination: The patient is a very pleasant 39 y.o. female in no acute distress. She appears well-developed and well-nourished and well groomed.   HEENT: Normocephalic, atraumatic, pupils are equal, round and reactive to light and accommodation. Funduscopic exam is normal with sharp disc margins noted. Extraocular tracking is good without limitation to gaze excursion or nystagmus noted. Normal smooth pursuit is noted. Hearing is grossly intact.  Face is symmetric with normal facial animation and normal facial sensation. Speech is clear with no dysarthria noted. There is no hypophonia. There is no lip, neck/head, jaw or voice tremor. Neck is supple with full range of passive and active motion. There are no carotid bruits on auscultation. Oropharynx exam reveals: mild mouth dryness, adequate dental hygiene and moderate airway crowding.   Chest: Clear to auscultation without wheezing, rhonchi or crackles noted.  Heart: S1+S2+0, regular and normal without murmurs, rubs or gallops noted.   Abdomen: Soft, non-tender and non-distended with normal bowel sounds appreciated on auscultation.  Extremities: There is no pitting edema in the  distal lower extremities bilaterally.   Skin: Warm and dry without trophic changes noted. There are no .  Musculoskeletal: exam reveals no obvious joint deformities, tenderness or joint swelling or erythema.   Neurologically:  Mental status: The patient is awake, alert and oriented in all 4 spheres. Her immediate and remote memory, attention, language skills and fund of knowledge are appropriate. There is no evidence of aphasia, agnosia, apraxia or anomia. Speech is clear with normal prosody and enunciation. Thought process is linear. Mood is normal and affect is normal.  Cranial nerves II - XII are as described above under HEENT exam. In addition: shoulder shrug is normal with equal shoulder height noted. Motor exam: Normal bulk, strength and tone is noted. There is no drift, tremor or rebound. Romberg is negative. Reflexes are 2+ throughout. Babinski: Toes are flexor bilaterally. Fine motor skills and coordination: intact with normal finger taps, normal hand movements, normal rapid alternating patting, normal foot taps and normal foot agility.  Cerebellar testing: No dysmetria or intention tremor on finger to nose testing. Heel to shin is unremarkable bilaterally. There is no truncal or gait ataxia.  Sensory exam: intact to light touch, pinprick, vibration, temperature sense with the exception of a small area in the left hand, and the first dorsal interosseous space of decreased pinprick and temperature sense, not new per patient, chronic from prior injuries/surgery to the left humerus.   Gait, station and balance: She stands easily. No veering to one side is noted. No leaning to one side is noted. Posture is age-appropriate and stance is narrow based. Gait shows normal stride length and normal pace. No problems turning are noted. Tandem walk is unremarkable. Intact toe and heel stance is noted.          Assessment and Plan:   In summary, Raysha Tilmon CORTES-ALTAMIRANO is a very pleasant 39 y.o.-year  old female with an underlying medical history of hypertension, depression, anxiety, reflux disease, history of left arm fracture with status post surgery and  hardware placement, and morbid obesity with a BMI of over 40, who presents for evaluation of her neck pain of over 2 years duration with recent flareup which prompted her to seek an urgent care visit.  Her neurological exam is normal, no obvious neurological deficit thankfully.  She reports left more than right radiating pain, she has stable numbness in the left hand from prior injury to the left arm.  I suggested we proceed with diagnostic testing in the form of cervical spine MRI with and without contrast as well as an EMG nerve conduction velocity test of both upper extremities.  She has an appointment with you next week.  I did advise her that we will seek insurance authorization for cervical spine MRI which can take a week or 2 on an average basis.  She was also advised to have a blood chemistry test done today to make sure her kidney function is okay as she has not had blood work in approximately 1 year.  She was agreeable.  Initially, she agreed to all these tests.  She was also advised to talk to about seeing a spine specialist at her next visit with you.  She may benefit from seeing a orthopedic or neurosurgeon.  She is encouraged to continue with the anti-inflammatory medication.  She was given written instructions but after the visit changed her mind and wanted to wait to pursue all these tests.  She was also encouraged to consider a sleep study as she may be at risk for obstructive sleep apnea given airway crowding, elevated BMI and enlarged neck size noted.  She indicated that she will discuss testing and evaluation with you at the next visit.  She also indicated that she would call back to schedule her testing.  I would be happy to see her back if needed.  I did go ahead and order her cervical spine MRI with and without contrast, CMP, and EMG  nerve conduction velocity test but she did not get these scheduled or blood work done prior to leaving clinic today.  She did receive her written instructions.  During the visit I answered the patient's and her mother's questions. Thank you very much for allowing me to participate in the care of this nice patient. If I can be of any further assistance to you please do not hesitate to call me at [phone].  Sincerely,   Huston Foley, MD, PhD

## 2019-11-18 ENCOUNTER — Telehealth: Payer: Self-pay | Admitting: Neurology

## 2019-11-18 ENCOUNTER — Other Ambulatory Visit: Payer: Self-pay | Admitting: Family Medicine

## 2019-11-18 NOTE — Telephone Encounter (Signed)
[employer] pending faxed notes 

## 2019-11-19 ENCOUNTER — Telehealth: Payer: Self-pay | Admitting: Family Medicine

## 2019-11-19 ENCOUNTER — Encounter: Payer: Self-pay | Admitting: Family Medicine

## 2019-11-19 ENCOUNTER — Ambulatory Visit (INDEPENDENT_AMBULATORY_CARE_PROVIDER_SITE_OTHER): Payer: No Typology Code available for payment source | Admitting: Family Medicine

## 2019-11-19 ENCOUNTER — Other Ambulatory Visit: Payer: No Typology Code available for payment source

## 2019-11-19 ENCOUNTER — Other Ambulatory Visit: Payer: Self-pay

## 2019-11-19 VITALS — BP 136/89 | HR 90 | Temp 97.7°F | Resp 12 | Ht 62.0 in | Wt 251.2 lb

## 2019-11-19 DIAGNOSIS — K760 Fatty (change of) liver, not elsewhere classified: Secondary | ICD-10-CM

## 2019-11-19 DIAGNOSIS — R7303 Prediabetes: Secondary | ICD-10-CM

## 2019-11-19 DIAGNOSIS — M542 Cervicalgia: Secondary | ICD-10-CM | POA: Diagnosis not present

## 2019-11-19 DIAGNOSIS — R748 Abnormal levels of other serum enzymes: Secondary | ICD-10-CM | POA: Diagnosis not present

## 2019-11-19 DIAGNOSIS — Z87828 Personal history of other (healed) physical injury and trauma: Secondary | ICD-10-CM | POA: Diagnosis not present

## 2019-11-19 DIAGNOSIS — Z01419 Encounter for gynecological examination (general) (routine) without abnormal findings: Secondary | ICD-10-CM

## 2019-11-19 DIAGNOSIS — S4992XS Unspecified injury of left shoulder and upper arm, sequela: Secondary | ICD-10-CM

## 2019-11-19 DIAGNOSIS — E785 Hyperlipidemia, unspecified: Secondary | ICD-10-CM

## 2019-11-19 DIAGNOSIS — G629 Polyneuropathy, unspecified: Secondary | ICD-10-CM

## 2019-11-19 DIAGNOSIS — E559 Vitamin D deficiency, unspecified: Secondary | ICD-10-CM

## 2019-11-19 DIAGNOSIS — E781 Pure hyperglyceridemia: Secondary | ICD-10-CM

## 2019-11-19 NOTE — Patient Instructions (Addendum)
Call neurology to schedule cervical MRI and EMG nerve conduction studies of bilateral upper extremities.  Please ask front desk if you can have Dr. Charlean Sanfilippo labs drawn as well, and sent to her.

## 2019-11-19 NOTE — Telephone Encounter (Signed)
I called to check the status it is still pending they did receive the fax.
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
## 2019-11-19 NOTE — Telephone Encounter (Signed)
Request to Joselyn Glassman to make this 4/27 appt for instead of the 15 min listed.  --glh

## 2019-11-20 NOTE — Telephone Encounter (Signed)
I called to check the status it is still in review.

## 2019-11-21 ENCOUNTER — Other Ambulatory Visit: Payer: No Typology Code available for payment source

## 2019-11-21 ENCOUNTER — Other Ambulatory Visit: Payer: Self-pay | Admitting: Family Medicine

## 2019-11-21 ENCOUNTER — Encounter: Payer: Self-pay | Admitting: Obstetrics and Gynecology

## 2019-11-21 DIAGNOSIS — B354 Tinea corporis: Secondary | ICD-10-CM

## 2019-11-21 DIAGNOSIS — I10 Essential (primary) hypertension: Secondary | ICD-10-CM

## 2019-11-24 NOTE — Telephone Encounter (Signed)
[employer] did not approve the MRI.  "the reason this request cannot be approved is because one of the following must be met. Follow up content (in person, by phone, by mail or by messaging) with your doctor confirmed a failed recent (within three months) six week trial of doctor prescribed treatment. Supported treatments include (but are not limited to) medications for swelling for pain, physical therapy, and/or oral or injected steroids. You have a sign or symptoms that suggests a serious underlying condition for which a trial of treatment is not needed.  If you would like to call to do a peer to peer the phone number is [phone] and the case number is [id number].

## 2019-11-24 NOTE — Telephone Encounter (Signed)
Please call patient and advise her that her cervical spine or neck MRI was not approved by her insurance. At this juncture, I would recommend that she follow-up with her primary care physician and discuss the option of seeing a spine specialist through orthopedics or neurosurgery.

## 2019-11-24 NOTE — Telephone Encounter (Signed)
I called pt to discuss. No answer, left a message asking her to call me back. 

## 2019-11-25 ENCOUNTER — Encounter: Payer: Self-pay | Admitting: Family Medicine

## 2019-11-25 ENCOUNTER — Other Ambulatory Visit: Payer: No Typology Code available for payment source

## 2019-11-25 ENCOUNTER — Other Ambulatory Visit: Payer: Self-pay

## 2019-11-25 ENCOUNTER — Encounter: Payer: Self-pay | Admitting: Neurology

## 2019-11-25 DIAGNOSIS — E785 Hyperlipidemia, unspecified: Secondary | ICD-10-CM

## 2019-11-25 DIAGNOSIS — E559 Vitamin D deficiency, unspecified: Secondary | ICD-10-CM

## 2019-11-25 DIAGNOSIS — R7303 Prediabetes: Secondary | ICD-10-CM

## 2019-11-25 DIAGNOSIS — E282 Polycystic ovarian syndrome: Secondary | ICD-10-CM

## 2019-11-25 DIAGNOSIS — E781 Pure hyperglyceridemia: Secondary | ICD-10-CM

## 2019-11-25 DIAGNOSIS — Z8739 Personal history of other diseases of the musculoskeletal system and connective tissue: Secondary | ICD-10-CM

## 2019-11-25 DIAGNOSIS — G629 Polyneuropathy, unspecified: Secondary | ICD-10-CM

## 2019-11-25 DIAGNOSIS — R200 Anesthesia of skin: Secondary | ICD-10-CM

## 2019-11-25 DIAGNOSIS — M542 Cervicalgia: Secondary | ICD-10-CM

## 2019-11-25 DIAGNOSIS — M5412 Radiculopathy, cervical region: Secondary | ICD-10-CM

## 2019-11-25 DIAGNOSIS — S4992XS Unspecified injury of left shoulder and upper arm, sequela: Secondary | ICD-10-CM

## 2019-11-25 DIAGNOSIS — K760 Fatty (change of) liver, not elsewhere classified: Secondary | ICD-10-CM

## 2019-11-25 DIAGNOSIS — R748 Abnormal levels of other serum enzymes: Secondary | ICD-10-CM

## 2019-11-25 DIAGNOSIS — Z87828 Personal history of other (healed) physical injury and trauma: Secondary | ICD-10-CM

## 2019-11-25 NOTE — Addendum Note (Signed)
Addended by: Stan Head on: 11/25/2019 09:13 AM   Modules accepted: Orders

## 2019-11-25 NOTE — Telephone Encounter (Signed)
Discussed with pt via mychart.

## 2019-11-26 ENCOUNTER — Encounter: Payer: Self-pay | Admitting: Internal Medicine

## 2019-11-26 LAB — COMPREHENSIVE METABOLIC PANEL
ALT: 23 IU/L (ref 0–32)
AST: 19 IU/L (ref 0–40)
Albumin/Globulin Ratio: 1.6 (ref 1.2–2.2)
Albumin: 4.2 g/dL (ref 3.8–4.8)
Alkaline Phosphatase: 83 IU/L (ref 39–117)
BUN/Creatinine Ratio: 15 (ref 9–23)
BUN: 11 mg/dL (ref 6–24)
Bilirubin Total: 0.5 mg/dL (ref 0.0–1.2)
CO2: 27 mmol/L (ref 20–29)
Calcium: 9.6 mg/dL (ref 8.7–10.2)
Chloride: 98 mmol/L (ref 96–106)
Creatinine, Ser: 0.72 mg/dL (ref 0.57–1.00)
GFR calc Af Amer: 121 mL/min/{1.73_m2} (ref >59)
GFR calc non Af Amer: 105 mL/min/{1.73_m2} (ref >59)
Globulin, Total: 2.6 g/dL (ref 1.5–4.5)
Glucose: 113 mg/dL — ABNORMAL HIGH (ref 65–99)
Potassium: 4.4 mmol/L (ref 3.5–5.2)
Sodium: 138 mmol/L (ref 134–144)
Total Protein: 6.8 g/dL (ref 6.0–8.5)

## 2019-11-26 LAB — LIPID PANEL
Chol/HDL Ratio: 4.6 ratio — ABNORMAL HIGH (ref 0.0–4.4)
Cholesterol, Total: 255 mg/dL — ABNORMAL HIGH (ref 100–199)
HDL: 55 mg/dL (ref >39)
LDL Chol Calc (NIH): 164 mg/dL — ABNORMAL HIGH (ref 0–99)
Triglycerides: 194 mg/dL — ABNORMAL HIGH (ref 0–149)
VLDL Cholesterol Cal: 36 mg/dL (ref 5–40)

## 2019-11-26 LAB — CBC
Hematocrit: 45.2 % (ref 34.0–46.6)
Hemoglobin: 15.1 g/dL (ref 11.1–15.9)
MCH: 31 pg (ref 26.6–33.0)
MCHC: 33.4 g/dL (ref 31.5–35.7)
MCV: 93 fL (ref 79–97)
Platelets: 323 10*3/uL (ref 150–450)
RBC: 4.87 x10E6/uL (ref 3.77–5.28)
RDW: 13.1 % (ref 11.7–15.4)
WBC: 11.4 10*3/uL — ABNORMAL HIGH (ref 3.4–10.8)

## 2019-11-26 LAB — HEMOGLOBIN A1C
Est. average glucose Bld gHb Est-mCnc: 128 mg/dL
Hgb A1c MFr Bld: 6.1 % — ABNORMAL HIGH (ref 4.8–5.6)

## 2019-11-26 LAB — VITAMIN D 25 HYDROXY (VIT D DEFICIENCY, FRACTURES): Vit D, 25-Hydroxy: 49.2 ng/mL (ref 30.0–100.0)

## 2019-11-26 LAB — T4, FREE: Free T4: 1.49 ng/dL (ref 0.82–1.77)

## 2019-11-26 LAB — TSH: TSH: 2.92 u[IU]/mL (ref 0.450–4.500)

## 2019-12-01 ENCOUNTER — Ambulatory Visit: Payer: No Typology Code available for payment source | Admitting: Family Medicine

## 2019-12-01 LAB — LUTEINIZING HORMONE: LH: 6.7 m[IU]/mL

## 2019-12-01 LAB — TESTOSTERONE, FREE AND TOTAL (INCLUDES SHBG)-(MALES)
% Free Testosterone: 0.8 %
Free Testosterone, S: 2.5 pg/mL
Sex Hormone Binding Globulin: 73.3 nmol/L
Testosterone, Serum (Total): 31 ng/dL

## 2019-12-01 LAB — DHEA-SULFATE: DHEA-SO4: 98.8 ug/dL (ref 57.3–279.2)

## 2019-12-01 LAB — ANDROSTENEDIONE: Androstenedione: 80 ng/dL (ref 41–262)

## 2019-12-01 LAB — PROLACTIN: Prolactin: 12.7 ng/mL (ref 4.8–23.3)

## 2019-12-01 LAB — 17-HYDROXYPROGESTERONE: 17-Hydroxyprogesterone: 15 ng/dL

## 2019-12-01 LAB — ESTRADIOL: Estradiol: 43.1 pg/mL

## 2019-12-01 LAB — FOLLICLE STIMULATING HORMONE: FSH: 7.4 m[IU]/mL

## 2019-12-10 ENCOUNTER — Other Ambulatory Visit: Payer: Self-pay

## 2019-12-11 ENCOUNTER — Ambulatory Visit: Payer: No Typology Code available for payment source | Admitting: Obstetrics and Gynecology

## 2019-12-24 ENCOUNTER — Encounter: Payer: Self-pay | Admitting: Physician Assistant

## 2019-12-24 ENCOUNTER — Telehealth (INDEPENDENT_AMBULATORY_CARE_PROVIDER_SITE_OTHER): Payer: No Typology Code available for payment source | Admitting: Physician Assistant

## 2019-12-24 ENCOUNTER — Other Ambulatory Visit: Payer: Self-pay

## 2019-12-24 VITALS — HR 96 | Temp 100.3°F | Ht 62.0 in | Wt 237.8 lb

## 2019-12-24 DIAGNOSIS — J019 Acute sinusitis, unspecified: Secondary | ICD-10-CM

## 2019-12-24 DIAGNOSIS — R509 Fever, unspecified: Secondary | ICD-10-CM

## 2019-12-24 DIAGNOSIS — R05 Cough: Secondary | ICD-10-CM | POA: Diagnosis not present

## 2019-12-24 DIAGNOSIS — I10 Essential (primary) hypertension: Secondary | ICD-10-CM

## 2019-12-24 DIAGNOSIS — R058 Other specified cough: Secondary | ICD-10-CM

## 2019-12-24 MED ORDER — HYDROCOD POLST-CPM POLST ER 10-8 MG/5ML PO SUER: 5.0000 mL | Freq: Two times a day (BID) | ORAL | 0 refills | Status: DC | PRN

## 2019-12-24 MED ORDER — LOSARTAN POTASSIUM 100 MG PO TABS: 50.0000 mg | ORAL_TABLET | Freq: Every day | ORAL | 1 refills | Status: DC

## 2019-12-24 MED ORDER — AMOXICILLIN-POT CLAVULANATE 875-125 MG PO TABS: 1.0000 | ORAL_TABLET | Freq: Two times a day (BID) | ORAL | 0 refills | Status: DC
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
## 2019-12-30 ENCOUNTER — Encounter: Payer: Self-pay | Admitting: Family Medicine

## 2019-12-30 ENCOUNTER — Ambulatory Visit (INDEPENDENT_AMBULATORY_CARE_PROVIDER_SITE_OTHER): Payer: No Typology Code available for payment source | Admitting: Family Medicine

## 2019-12-30 ENCOUNTER — Other Ambulatory Visit: Payer: Self-pay

## 2019-12-30 VITALS — Temp 98.4°F | Ht 62.0 in | Wt 237.0 lb

## 2019-12-30 DIAGNOSIS — J019 Acute sinusitis, unspecified: Secondary | ICD-10-CM

## 2019-12-30 DIAGNOSIS — Z87828 Personal history of other (healed) physical injury and trauma: Secondary | ICD-10-CM

## 2019-12-30 DIAGNOSIS — R059 Cough, unspecified: Secondary | ICD-10-CM

## 2019-12-30 DIAGNOSIS — M542 Cervicalgia: Secondary | ICD-10-CM

## 2019-12-30 DIAGNOSIS — R05 Cough: Secondary | ICD-10-CM

## 2019-12-30 DIAGNOSIS — G629 Polyneuropathy, unspecified: Secondary | ICD-10-CM | POA: Diagnosis not present

## 2019-12-30 DIAGNOSIS — R509 Fever, unspecified: Secondary | ICD-10-CM

## 2019-12-30 DIAGNOSIS — Z792 Long term (current) use of antibiotics: Secondary | ICD-10-CM
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
## 2020-02-10 ENCOUNTER — Other Ambulatory Visit: Payer: Self-pay | Admitting: Family Medicine

## 2020-02-10 DIAGNOSIS — I10 Essential (primary) hypertension: Secondary | ICD-10-CM

## 2020-02-10 DIAGNOSIS — R7303 Prediabetes: Secondary | ICD-10-CM

## 2020-02-10 DIAGNOSIS — E282 Polycystic ovarian syndrome: Secondary | ICD-10-CM

## 2020-03-26 ENCOUNTER — Other Ambulatory Visit: Payer: Self-pay | Admitting: Physician Assistant

## 2020-03-26 DIAGNOSIS — J019 Acute sinusitis, unspecified: Secondary | ICD-10-CM

## 2020-03-29 ENCOUNTER — Telehealth: Payer: Self-pay | Admitting: Physician Assistant

## 2020-03-29 DIAGNOSIS — R7303 Prediabetes: Secondary | ICD-10-CM

## 2020-03-29 DIAGNOSIS — I10 Essential (primary) hypertension: Secondary | ICD-10-CM

## 2020-03-29 DIAGNOSIS — E282 Polycystic ovarian syndrome: Secondary | ICD-10-CM

## 2020-03-29 MED ORDER — PANTOPRAZOLE SODIUM 40 MG PO TBEC: 40.0000 mg | DELAYED_RELEASE_TABLET | Freq: Every day | ORAL | 0 refills | Status: DC

## 2020-03-29 MED ORDER — LOSARTAN POTASSIUM 100 MG PO TABS: 50.0000 mg | ORAL_TABLET | Freq: Every day | ORAL | 0 refills | Status: AC

## 2020-03-29 MED ORDER — METFORMIN HCL 500 MG PO TABS: 500.0000 mg | ORAL_TABLET | Freq: Two times a day (BID) | ORAL | 0 refills | Status: DC

## 2020-03-29 MED ORDER — HYDROCHLOROTHIAZIDE 12.5 MG PO TABS: 12.5000 mg | ORAL_TABLET | Freq: Every day | ORAL | 0 refills | Status: DC

## 2020-03-29 NOTE — Telephone Encounter (Signed)
Patient is requesting a refill of her hydrochlorothiazide, losartan, pantoprazole and metformin. She knows she is overdue for a med f/u for refills and has scheduled our first available (04/29/20) but is almost out of these meds. She is hoping for a refill to get her to this appt, if approved please send to [facility] in [place].

## 2020-03-29 NOTE — Addendum Note (Signed)
Addended by: Stan Head on: 03/29/2020 03:14 PM   Modules accepted: Orders

## 2020-04-29 ENCOUNTER — Other Ambulatory Visit: Payer: Self-pay

## 2020-04-29 ENCOUNTER — Encounter: Payer: Self-pay | Admitting: Physician Assistant

## 2020-04-29 ENCOUNTER — Ambulatory Visit (INDEPENDENT_AMBULATORY_CARE_PROVIDER_SITE_OTHER): Payer: No Typology Code available for payment source | Admitting: Physician Assistant

## 2020-04-29 VITALS — BP 117/78 | HR 88 | Temp 98.7°F | Ht 62.0 in | Wt 256.1 lb

## 2020-04-29 DIAGNOSIS — I10 Essential (primary) hypertension: Secondary | ICD-10-CM | POA: Diagnosis not present

## 2020-04-29 DIAGNOSIS — K219 Gastro-esophageal reflux disease without esophagitis: Secondary | ICD-10-CM

## 2020-04-29 DIAGNOSIS — R7303 Prediabetes: Secondary | ICD-10-CM | POA: Diagnosis not present

## 2020-04-29 DIAGNOSIS — E282 Polycystic ovarian syndrome: Secondary | ICD-10-CM

## 2020-04-29 NOTE — Patient Instructions (Signed)
DASH Eating Plan DASH stands for "Dietary Approaches to Stop Hypertension." The DASH eating plan is a healthy eating plan that has been shown to reduce high blood pressure (hypertension). It may also reduce your risk for type 2 diabetes, heart disease, and stroke. The DASH eating plan may also help with weight loss. What are tips for following this plan?  General guidelines  Avoid eating more than 2,300 mg (milligrams) of salt (sodium) a day. If you have hypertension, you may need to reduce your sodium intake to 1,500 mg a day.  Limit alcohol intake to no more than 1 drink a day for nonpregnant women and 2 drinks a day for men. One drink equals 12 oz of beer, 5 oz of wine, or 1 oz of hard liquor.  Work with your health care provider to maintain a healthy body weight or to lose weight. Ask what an ideal weight is for you.  Get at least 30 minutes of exercise that causes your heart to beat faster (aerobic exercise) most days of the week. Activities may include walking, swimming, or biking.  Work with your health care provider or diet and nutrition specialist (dietitian) to adjust your eating plan to your individual calorie needs. Reading food labels   Check food labels for the amount of sodium per serving. Choose foods with less than 5 percent of the Daily Value of sodium. Generally, foods with less than 300 mg of sodium per serving fit into this eating plan.  To find whole grains, look for the word "whole" as the first word in the ingredient list. Shopping  Buy products labeled as "low-sodium" or "no salt added."  Buy fresh foods. Avoid canned foods and premade or frozen meals. Cooking  Avoid adding salt when cooking. Use salt-free seasonings or herbs instead of table salt or sea salt. Check with your health care provider or pharmacist before using salt substitutes.  Do not fry foods. Cook foods using healthy methods such as baking, boiling, grilling, and broiling instead.  Cook with  heart-healthy oils, such as olive, canola, soybean, or sunflower oil. Meal planning  Eat a balanced diet that includes: ? 5 or more servings of fruits and vegetables each day. At each meal, try to fill half of your plate with fruits and vegetables. ? Up to 6-8 servings of whole grains each day. ? Less than 6 oz of lean meat, poultry, or fish each day. A 3-oz serving of meat is about the same size as a deck of cards. One egg equals 1 oz. ? 2 servings of low-fat dairy each day. ? A serving of nuts, seeds, or beans 5 times each week. ? Heart-healthy fats. Healthy fats called Omega-3 fatty acids are found in foods such as flaxseeds and coldwater fish, like sardines, salmon, and mackerel.  Limit how much you eat of the following: ? Canned or prepackaged foods. ? Food that is high in trans fat, such as fried foods. ? Food that is high in saturated fat, such as fatty meat. ? Sweets, desserts, sugary drinks, and other foods with added sugar. ? Full-fat dairy products.  Do not salt foods before eating.  Try to eat at least 2 vegetarian meals each week.  Eat more home-cooked food and less restaurant, buffet, and fast food.  When eating at a restaurant, ask that your food be prepared with less salt or no salt, if possible. What foods are recommended? The items listed may not be a complete list. Talk with your dietitian about   what dietary choices are best for you. Grains Whole-grain or whole-wheat bread. Whole-grain or whole-wheat pasta. Brown rice. Oatmeal. Quinoa. Bulgur. Whole-grain and low-sodium cereals. Pita bread. Low-fat, low-sodium crackers. Whole-wheat flour tortillas. Vegetables Fresh or frozen vegetables (raw, steamed, roasted, or grilled). Low-sodium or reduced-sodium tomato and vegetable juice. Low-sodium or reduced-sodium tomato sauce and tomato paste. Low-sodium or reduced-sodium canned vegetables. Fruits All fresh, dried, or frozen fruit. Canned fruit in natural juice (without  added sugar). Meat and other protein foods Skinless chicken or turkey. Ground chicken or turkey. Pork with fat trimmed off. Fish and seafood. Egg whites. Dried beans, peas, or lentils. Unsalted nuts, nut butters, and seeds. Unsalted canned beans. Lean cuts of beef with fat trimmed off. Low-sodium, lean deli meat. Dairy Low-fat (1%) or fat-free (skim) milk. Fat-free, low-fat, or reduced-fat cheeses. Nonfat, low-sodium ricotta or cottage cheese. Low-fat or nonfat yogurt. Low-fat, low-sodium cheese. Fats and oils Soft margarine without trans fats. Vegetable oil. Low-fat, reduced-fat, or light mayonnaise and salad dressings (reduced-sodium). Canola, safflower, olive, soybean, and sunflower oils. Avocado. Seasoning and other foods Herbs. Spices. Seasoning mixes without salt. Unsalted popcorn and pretzels. Fat-free sweets. What foods are not recommended? The items listed may not be a complete list. Talk with your dietitian about what dietary choices are best for you. Grains Baked goods made with fat, such as croissants, muffins, or some breads. Dry pasta or rice meal packs. Vegetables Creamed or fried vegetables. Vegetables in a cheese sauce. Regular canned vegetables (not low-sodium or reduced-sodium). Regular canned tomato sauce and paste (not low-sodium or reduced-sodium). Regular tomato and vegetable juice (not low-sodium or reduced-sodium). Pickles. Olives. Fruits Canned fruit in a light or heavy syrup. Fried fruit. Fruit in cream or butter sauce. Meat and other protein foods Fatty cuts of meat. Ribs. Fried meat. Bacon. Sausage. Bologna and other processed lunch meats. Salami. Fatback. Hotdogs. Bratwurst. Salted nuts and seeds. Canned beans with added salt. Canned or smoked fish. Whole eggs or egg yolks. Chicken or turkey with skin. Dairy Whole or 2% milk, cream, and half-and-half. Whole or full-fat cream cheese. Whole-fat or sweetened yogurt. Full-fat cheese. Nondairy creamers. Whipped toppings.  Processed cheese and cheese spreads. Fats and oils Butter. Stick margarine. Lard. Shortening. Ghee. Bacon fat. Tropical oils, such as coconut, palm kernel, or palm oil. Seasoning and other foods Salted popcorn and pretzels. Onion salt, garlic salt, seasoned salt, table salt, and sea salt. Worcestershire sauce. Tartar sauce. Barbecue sauce. Teriyaki sauce. Soy sauce, including reduced-sodium. Steak sauce. Canned and packaged gravies. Fish sauce. Oyster sauce. Cocktail sauce. Horseradish that you find on the shelf. Ketchup. Mustard. Meat flavorings and tenderizers. Bouillon cubes. Hot sauce and Tabasco sauce. Premade or packaged marinades. Premade or packaged taco seasonings. Relishes. Regular salad dressings. Where to find more information:  National Heart, Lung, and Blood Institute: www.nhlbi.nih.gov  American Heart Association: www.heart.org Summary  The DASH eating plan is a healthy eating plan that has been shown to reduce high blood pressure (hypertension). It may also reduce your risk for type 2 diabetes, heart disease, and stroke.  With the DASH eating plan, you should limit salt (sodium) intake to 2,300 mg a day. If you have hypertension, you may need to reduce your sodium intake to 1,500 mg a day.  When on the DASH eating plan, aim to eat more fresh fruits and vegetables, whole grains, lean proteins, low-fat dairy, and heart-healthy fats.  Work with your health care provider or diet and nutrition specialist (dietitian) to adjust your eating plan to your   individual calorie needs. This information is not intended to replace advice given to you by your health care provider. Make sure you discuss any questions you have with your health care provider. Document Revised: 08/03/2017 Document Reviewed: 08/14/2016 Elsevier Patient Education  2020 Elsevier Inc.  

## 2020-04-29 NOTE — Progress Notes (Signed)
Established Patient Office Visit  Subjective:  Patient ID: Vickie [name], female    DOB: [birth year]  Age: 39 y.o. MRN: [id number]  CC:  Chief Complaint  Patient presents with   Hypertension    HPI Vickie [name] presents for follow-up on hypertension. Pt denies chest pain, palpitations or dizziness. Pt reports some lower extremity swelling, but usually when sitting for long periods of time which usually subsides after walking. Taking medication as directed without side effects. Checks BP at home occasionally and readings range in 130s/80s. Pt follows a low salt diet. Reports good hydration.  Prediabetes: Reports she is taking Metformin 500 mg once daily because when she started taking it twice daily she had increased appetite. Denies increased thirst or urination.   GERD: Reports pantoprazole helps control heartburn symptoms.    Past Medical History:  Diagnosis Date   Abnormal Pap smear    Anxiety    Broken arm    Left    Complication of anesthesia    Epidural- baby's HR decreased ; developed  fever   Fatigue 2008   H/O dyspareunia 01/23/11   H/O varicella    Hx of candidiasis    Hx: UTI (urinary tract infection)    Hypertension    Increased BMI    Irregular periods/menstrual cycles 2008   Postpartum depression    Vaginal lesion 2009    Painful   Vaginal pain 01/23/11    Past Surgical History:  Procedure Laterality Date   repair broken arm     TONSILLECTOMY     Age 65   TUBAL LIGATION     WISDOM TOOTH EXTRACTION      Family History  Problem Relation Age of Onset   Hypertension Mother    Diabetes Mother    Cancer Mother        skin   Leukemia Mother    Diabetes Father    Hypertension Father    COPD Father    Stroke Maternal Grandmother     Social History   Socioeconomic History   Marital status: Married    Spouse name: Not on file   Number of children: Not on file   Years of education: Not on  file   Highest education level: Not on file  Occupational History   Not on file  Tobacco Use   Smoking status: Never Smoker   Smokeless tobacco: Never Used  Vaping Use   Vaping Use: Never used  Substance and Sexual Activity   Alcohol use: Yes    Alcohol/week: 1.0 standard drink    Types: 1 Standard drinks or equivalent per week    Comment: social   Drug use: No   Sexual activity: Never    Partners: Male    Comment: BTL  Other Topics Concern   Not on file  Social History Narrative   Not on file   Social Determinants of Health   Financial Resource Strain:    Difficulty of Paying Living Expenses: Not on file  Food Insecurity:    Worried About Programme researcher, broadcasting/film/video in the Last Year: Not on file   [employer] of Food in the Last Year: Not on file  Transportation Needs:    Lack of Transportation (Medical): Not on file   Lack of Transportation (Non-Medical): Not on file  Physical Activity:    Days of Exercise per Week: Not on file   Minutes of Exercise per Session: Not on file  Stress:    Feeling of  Stress : Not on file  Social Connections:    Frequency of Communication with Friends and Family: Not on file   Frequency of Social Gatherings with Friends and Family: Not on file   Attends Religious Services: Not on file   Active Member of Clubs or Organizations: Not on file   Attends Banker Meetings: Not on file   Marital Status: Not on file  Intimate Partner Violence:    Fear of Current or Ex-Partner: Not on file   Emotionally Abused: Not on file   Physically Abused: Not on file   Sexually Abused: Not on file    Outpatient Medications Prior to Visit  Medication Sig Dispense Refill   Acetaminophen (TYLENOL PO) Take by mouth. PRN     aspirin (BAYER LOW DOSE) 81 MG EC tablet Take 81 mg by mouth daily. Swallow whole.     cholecalciferol (VITAMIN D3) 25 MCG (1000 UT) tablet Take 1,000 Units by mouth daily.     Cyanocobalamin (B-12)  2000 MCG TABS Take 2,000 mg by mouth daily.     hydrochlorothiazide (HYDRODIURIL) 12.5 MG tablet Take 1 tablet (12.5 mg total) by mouth daily. 60 tablet 0   losartan (COZAAR) 100 MG tablet Take 0.5 tablets (50 mg total) by mouth daily. 30 tablet 0   metFORMIN (GLUCOPHAGE) 500 MG tablet Take 1 tablet (500 mg total) by mouth 2 (two) times daily with a meal. Take 1 tablet by mouth 2 times daily with meal 120 tablet 0   Multiple Vitamin (MULTIVITAMIN) capsule Take 1 capsule by mouth daily.     pantoprazole (PROTONIX) 40 MG tablet Take 1 tablet (40 mg total) by mouth daily. 60 tablet 0   amoxicillin-clavulanate (AUGMENTIN) 875-125 MG tablet Take 1 tablet by mouth 2 (two) times daily. 20 tablet 0   chlorpheniramine-HYDROcodone (TUSSIONEX PENNKINETIC ER) 10-8 MG/5ML SUER Take 5 mLs by mouth every 12 (twelve) hours as needed for cough. 140 mL 0   No facility-administered medications prior to visit.    No Known Allergies  ROS Review of Systems  A fourteen system review of systems was performed and found to be positive as per HPI.  Objective:    Physical Exam General:  Well Developed, well nourished, appropriate for stated age.  Neuro:  Alert and oriented,  extra-ocular muscles intact  HEENT:  Normocephalic, atraumatic, neck supple Skin:  no gross rash, warm, pink. Cardiac:  RRR, S1 S2 Respiratory:  ECTA B/L and A/P, Not using accessory muscles, speaking in full sentences- unlabored. Vascular:  Ext warm, no cyanosis apprec.; cap RF less 2 sec. Psych:  No HI/SI, judgement and insight good, Euthymic mood. Full Affect.  BP 117/78    Pulse 88    Temp 98.7 F (37.1 C) (Oral)    Ht 5\' 2"  (1.575 m)    Wt 256 lb 1.6 oz (116.2 kg)    SpO2 97%    BMI 46.84 kg/m  Wt Readings from Last 3 Encounters:  04/29/20 256 lb 1.6 oz (116.2 kg)  12/30/19 237 lb (107.5 kg)  12/24/19 237 lb 12.8 oz (107.9 kg)     Health Maintenance Due  Topic Date Due   Hepatitis C Screening  Never done   COVID-19  Vaccine (1) Never done   INFLUENZA VACCINE  Never done    There are no preventive care reminders to display for this patient.  Lab Results  Component Value Date   TSH 2.920 11/25/2019   Lab Results  Component Value Date  WBC 11.4 (H) 11/25/2019   HGB 15.1 11/25/2019   HCT 45.2 11/25/2019   MCV 93 11/25/2019   PLT 323 11/25/2019   Lab Results  Component Value Date   NA 138 11/25/2019   K 4.4 11/25/2019   CO2 27 11/25/2019   GLUCOSE 113 (H) 11/25/2019   BUN 11 11/25/2019   CREATININE 0.72 11/25/2019   BILITOT 0.5 11/25/2019   ALKPHOS 83 11/25/2019   AST 19 11/25/2019   ALT 23 11/25/2019   PROT 6.8 11/25/2019   ALBUMIN 4.2 11/25/2019   CALCIUM 9.6 11/25/2019   Lab Results  Component Value Date   CHOL 255 (H) 11/25/2019   Lab Results  Component Value Date   HDL 55 11/25/2019   Lab Results  Component Value Date   LDLCALC 164 (H) 11/25/2019   Lab Results  Component Value Date   TRIG 194 (H) 11/25/2019   Lab Results  Component Value Date   CHOLHDL 4.6 (H) 11/25/2019   Lab Results  Component Value Date   HGBA1C 6.1 (H) 11/25/2019      Assessment & Plan:   Problem List Items Addressed This Visit      Cardiovascular and Mediastinum   HTN, goal below 130/80 - Primary (Chronic)     Other   Prediabetes    Other Visit Diagnoses    Gastroesophageal reflux disease, unspecified whether esophagitis present         HTN: - BP today is stable - Continue current medication regimen. - Continue DASH diet. - Encourage to stay as active as possible. - Plan to check CMP with CPE  Prediabetes: -Asymptomatic -Last A1c 6.1, stable -Follow low carbohydrate and glucose diet. -Plan to recheck A1c with CPE  GERD: -Stable -Continue current medication regimen. -Recommend to avoid provocative food.  No orders of the defined types were placed in this encounter.   Follow-up: Return in about 3 months (around 07/30/2020) for CPE and FBW few days prior OV.    Note:  This note was prepared with assistance of Dragon voice recognition software. Occasional wrong-word or sound-a-like substitutions may have occurred due to the inherent limitations of voice recognition software.  Mayer Masker, PA-C

## 2020-05-16 MED ORDER — METFORMIN HCL 500 MG PO TABS: 500.0000 mg | ORAL_TABLET | Freq: Every day | ORAL | 0 refills | Status: DC

## 2020-05-28 ENCOUNTER — Other Ambulatory Visit: Payer: Self-pay | Admitting: Physician Assistant

## 2020-05-28 DIAGNOSIS — E282 Polycystic ovarian syndrome: Secondary | ICD-10-CM

## 2020-05-28 DIAGNOSIS — I10 Essential (primary) hypertension: Secondary | ICD-10-CM

## 2020-05-28 DIAGNOSIS — R7303 Prediabetes: Secondary | ICD-10-CM

## 2020-07-24 ENCOUNTER — Other Ambulatory Visit: Payer: Self-pay | Admitting: Physician Assistant

## 2020-07-24 DIAGNOSIS — E282 Polycystic ovarian syndrome: Secondary | ICD-10-CM

## 2020-07-24 DIAGNOSIS — R7303 Prediabetes: Secondary | ICD-10-CM

## 2020-07-24 DIAGNOSIS — I10 Essential (primary) hypertension: Secondary | ICD-10-CM

## 2020-08-03 ENCOUNTER — Encounter: Payer: No Typology Code available for payment source | Admitting: Physician Assistant

## 2020-09-21 ENCOUNTER — Other Ambulatory Visit: Payer: Self-pay | Admitting: Physician Assistant

## 2020-09-21 DIAGNOSIS — R7303 Prediabetes: Secondary | ICD-10-CM

## 2020-09-21 DIAGNOSIS — E282 Polycystic ovarian syndrome: Secondary | ICD-10-CM

## 2020-09-21 DIAGNOSIS — I10 Essential (primary) hypertension: Secondary | ICD-10-CM

## 2020-10-07 ENCOUNTER — Telehealth: Payer: Self-pay | Admitting: Physician Assistant

## 2020-10-07 NOTE — Telephone Encounter (Signed)
Patient was prescribed the medication in the past. ---EDITED---

## 2020-10-07 NOTE — Telephone Encounter (Signed)
Patient has a stuffy nose, fever of around 99 and coughing. Patient is coughing throughout day and the cough is more worse at night. I advised patient we have nothing this week. I told her I would put a phone note in. Patient has been prescribed hydrocodonechlorphen and she states it helped. It was prescribed by former doctor. It helps patient sleep and suppresses the cough. If needed patient uses [facility] in [place] at [facility], thanks.

## 2020-10-07 NOTE — Telephone Encounter (Signed)
Spoke with pt and advised she would need covid testing and apt to address illness. Patient advised no apts available in our office this week and advised to go to [facility] for evaluation and treatment. Patient verbalized understanding. AS, CMA

## 2020-10-28 ENCOUNTER — Encounter: Payer: No Typology Code available for payment source | Admitting: Physician Assistant

## 2020-11-12 ENCOUNTER — Other Ambulatory Visit: Payer: Self-pay | Admitting: Physician Assistant

## 2020-11-12 ENCOUNTER — Telehealth: Payer: Self-pay | Admitting: Physician Assistant

## 2020-11-12 DIAGNOSIS — I10 Essential (primary) hypertension: Secondary | ICD-10-CM

## 2020-11-12 DIAGNOSIS — E282 Polycystic ovarian syndrome: Secondary | ICD-10-CM

## 2020-11-12 DIAGNOSIS — R7303 Prediabetes: Secondary | ICD-10-CM

## 2020-11-12 NOTE — Telephone Encounter (Signed)
Please contact pt to schedule apt per last AVS for further medication refills. AS, CMA 

## 2020-11-15 NOTE — Telephone Encounter (Signed)
Left voicemail for patient letting her know to call to schedule appointment per last AVS for further medication refills. 11-15-20

## 2020-12-05 ENCOUNTER — Other Ambulatory Visit: Payer: Self-pay | Admitting: Physician Assistant

## 2020-12-05 DIAGNOSIS — E282 Polycystic ovarian syndrome: Secondary | ICD-10-CM

## 2020-12-05 DIAGNOSIS — R7303 Prediabetes: Secondary | ICD-10-CM

## 2020-12-05 DIAGNOSIS — I10 Essential (primary) hypertension: Secondary | ICD-10-CM

## 2020-12-06 ENCOUNTER — Telehealth: Payer: Self-pay | Admitting: Physician Assistant

## 2020-12-06 NOTE — Telephone Encounter (Signed)
Please contact pt to schedule apt per last AVS. Per refill protocol no further refills until patient is seen. AS, CMA

## 2020-12-06 NOTE — Telephone Encounter (Signed)
Patient contacted and voicemail was full and could not leave a voicemail.

## 2020-12-21 ENCOUNTER — Other Ambulatory Visit: Payer: Self-pay | Admitting: Physician Assistant

## 2020-12-21 DIAGNOSIS — R7303 Prediabetes: Secondary | ICD-10-CM

## 2020-12-21 DIAGNOSIS — E282 Polycystic ovarian syndrome: Secondary | ICD-10-CM

## 2020-12-21 NOTE — Telephone Encounter (Signed)
Contacted patient and left msg advising that patient needs apt for further medication refills. Denying refill per medication refill protocol. Kandis Cocking is aware and is agreeable. AS, CMA

## 2020-12-22 ENCOUNTER — Other Ambulatory Visit: Payer: Self-pay | Admitting: Physician Assistant

## 2020-12-22 DIAGNOSIS — I10 Essential (primary) hypertension: Secondary | ICD-10-CM

## 2021-02-01 ENCOUNTER — Other Ambulatory Visit (HOSPITAL_COMMUNITY): Payer: Self-pay | Admitting: Family Medicine

## 2021-02-01 ENCOUNTER — Other Ambulatory Visit: Payer: Self-pay | Admitting: Family Medicine

## 2021-02-01 DIAGNOSIS — R1010 Upper abdominal pain, unspecified: Secondary | ICD-10-CM

## 2021-02-07 ENCOUNTER — Ambulatory Visit (HOSPITAL_COMMUNITY)
Admission: RE | Admit: 2021-02-07 | Discharge: 2021-02-07 | Disposition: A | Discharge status: Home / Self Care | Payer: No Typology Code available for payment source | Source: Ambulatory Visit | Attending: Family Medicine | Admitting: Family Medicine

## 2021-02-07 ENCOUNTER — Other Ambulatory Visit: Payer: Self-pay

## 2021-02-07 DIAGNOSIS — R1010 Upper abdominal pain, unspecified: Secondary | ICD-10-CM | POA: Diagnosis not present

## 2021-12-15 ENCOUNTER — Other Ambulatory Visit: Payer: Self-pay

## 2021-12-15 ENCOUNTER — Encounter (HOSPITAL_BASED_OUTPATIENT_CLINIC_OR_DEPARTMENT_OTHER): Payer: Self-pay | Admitting: Emergency Medicine

## 2021-12-15 ENCOUNTER — Emergency Department (HOSPITAL_BASED_OUTPATIENT_CLINIC_OR_DEPARTMENT_OTHER): Payer: Self-pay

## 2021-12-15 ENCOUNTER — Inpatient Hospital Stay (HOSPITAL_BASED_OUTPATIENT_CLINIC_OR_DEPARTMENT_OTHER)
Admission: EM | Admit: 2021-12-15 | Discharge: 2021-12-17 | DRG: 392 | Disposition: A | Discharge status: Home / Self Care | Payer: Self-pay | Attending: Internal Medicine | Admitting: Internal Medicine

## 2021-12-15 DIAGNOSIS — K56600 Partial intestinal obstruction, unspecified as to cause: Principal | ICD-10-CM

## 2021-12-15 DIAGNOSIS — R7303 Prediabetes: Secondary | ICD-10-CM | POA: Diagnosis present

## 2021-12-15 DIAGNOSIS — Z6841 Body Mass Index (BMI) 40.0 and over, adult: Secondary | ICD-10-CM

## 2021-12-15 DIAGNOSIS — K529 Noninfective gastroenteritis and colitis, unspecified: Principal | ICD-10-CM | POA: Diagnosis present

## 2021-12-15 DIAGNOSIS — Z833 Family history of diabetes mellitus: Secondary | ICD-10-CM

## 2021-12-15 DIAGNOSIS — Z7984 Long term (current) use of oral hypoglycemic drugs: Secondary | ICD-10-CM

## 2021-12-15 DIAGNOSIS — K760 Fatty (change of) liver, not elsewhere classified: Secondary | ICD-10-CM | POA: Diagnosis present

## 2021-12-15 DIAGNOSIS — Z8249 Family history of ischemic heart disease and other diseases of the circulatory system: Secondary | ICD-10-CM

## 2021-12-15 DIAGNOSIS — I10 Essential (primary) hypertension: Secondary | ICD-10-CM | POA: Diagnosis present

## 2021-12-15 DIAGNOSIS — Z79899 Other long term (current) drug therapy: Secondary | ICD-10-CM

## 2021-12-15 DIAGNOSIS — K56609 Unspecified intestinal obstruction, unspecified as to partial versus complete obstruction: Secondary | ICD-10-CM | POA: Diagnosis present

## 2021-12-15 DIAGNOSIS — E785 Hyperlipidemia, unspecified: Secondary | ICD-10-CM | POA: Diagnosis present

## 2021-12-15 DIAGNOSIS — Z885 Allergy status to narcotic agent status: Secondary | ICD-10-CM

## 2021-12-15 DIAGNOSIS — Z7982 Long term (current) use of aspirin: Secondary | ICD-10-CM

## 2021-12-15 DIAGNOSIS — E66813 Obesity, class 3: Secondary | ICD-10-CM | POA: Diagnosis present

## 2021-12-15 LAB — DIFFERENTIAL
Abs Immature Granulocytes: 0.02 10*3/uL (ref 0.00–0.07)
Basophils Absolute: 0 10*3/uL (ref 0.0–0.1)
Basophils Relative: 0 %
Eosinophils Absolute: 0 10*3/uL (ref 0.0–0.5)
Eosinophils Relative: 0 %
Immature Granulocytes: 0 %
Lymphocytes Relative: 20 %
Lymphs Abs: 1.8 10*3/uL (ref 0.7–4.0)
Monocytes Absolute: 0.7 10*3/uL (ref 0.1–1.0)
Monocytes Relative: 7 %
Neutro Abs: 6.6 10*3/uL (ref 1.7–7.7)
Neutrophils Relative %: 73 %

## 2021-12-15 LAB — URINALYSIS, ROUTINE W REFLEX MICROSCOPIC
Bilirubin Urine: NEGATIVE
Glucose, UA: 100 mg/dL — AB
Hgb urine dipstick: NEGATIVE
Leukocytes,Ua: NEGATIVE
Nitrite: NEGATIVE
Protein, ur: 30 mg/dL — AB
Specific Gravity, Urine: >1.046 — ABNORMAL HIGH (ref 1.005–1.030)
pH: 5.5 (ref 5.0–8.0)

## 2021-12-15 LAB — COMPREHENSIVE METABOLIC PANEL
ALT: 36 U/L (ref 0–44)
AST: 27 U/L (ref 15–41)
Albumin: 4.7 g/dL (ref 3.5–5.0)
Alkaline Phosphatase: 75 U/L (ref 38–126)
Anion gap: 12 (ref 5–15)
BUN: 14 mg/dL (ref 6–20)
CO2: 27 mmol/L (ref 22–32)
Calcium: 10 mg/dL (ref 8.9–10.3)
Chloride: 95 mmol/L — ABNORMAL LOW (ref 98–111)
Creatinine, Ser: 0.73 mg/dL (ref 0.44–1.00)
GFR, Estimated: >60 mL/min (ref >60)
Glucose, Bld: 231 mg/dL — ABNORMAL HIGH (ref 70–99)
Potassium: 4.1 mmol/L (ref 3.5–5.1)
Sodium: 134 mmol/L — ABNORMAL LOW (ref 135–145)
Total Bilirubin: 0.7 mg/dL (ref 0.3–1.2)
Total Protein: 7.8 g/dL (ref 6.5–8.1)

## 2021-12-15 LAB — CBC
HCT: 45.9 % (ref 36.0–46.0)
Hemoglobin: 15.6 g/dL — ABNORMAL HIGH (ref 12.0–15.0)
MCH: 30.3 pg (ref 26.0–34.0)
MCHC: 34 g/dL (ref 30.0–36.0)
MCV: 89.1 fL (ref 80.0–100.0)
Platelets: 272 10*3/uL (ref 150–400)
RBC: 5.15 MIL/uL — ABNORMAL HIGH (ref 3.87–5.11)
RDW: 13.1 % (ref 11.5–15.5)
WBC: 9.4 10*3/uL (ref 4.0–10.5)
nRBC: 0 % (ref 0.0–0.2)

## 2021-12-15 LAB — GLUCOSE, CAPILLARY: Glucose-Capillary: 211 mg/dL — ABNORMAL HIGH (ref 70–99)

## 2021-12-15 LAB — PREGNANCY, URINE: Preg Test, Ur: NEGATIVE

## 2021-12-15 LAB — LIPASE, BLOOD: Lipase: 26 U/L (ref 11–51)

## 2021-12-15 MED ORDER — FENTANYL CITRATE PF 50 MCG/ML IJ SOSY
50.0000 ug | PREFILLED_SYRINGE | INTRAMUSCULAR | Status: AC | PRN
  Administered 2021-12-15 (×2): 50 ug via INTRAVENOUS
  Filled 2021-12-15 (×2): qty 1

## 2021-12-15 MED ORDER — IOHEXOL 300 MG/ML  SOLN
100.0000 mL | Freq: Once | INTRAMUSCULAR | Status: AC | PRN
  Administered 2021-12-15: 100 mL via INTRAVENOUS

## 2021-12-15 MED ORDER — HYDRALAZINE HCL 20 MG/ML IJ SOLN: 10.0000 mg | Freq: Four times a day (QID) | INTRAMUSCULAR | Status: DC | PRN

## 2021-12-15 MED ORDER — HYDROMORPHONE HCL 1 MG/ML IJ SOLN
1.0000 mg | Freq: Once | INTRAMUSCULAR | Status: AC
  Administered 2021-12-15: 1 mg via INTRAVENOUS
  Filled 2021-12-15: qty 1

## 2021-12-15 MED ORDER — ONDANSETRON HCL 4 MG/2ML IJ SOLN
4.0000 mg | Freq: Once | INTRAMUSCULAR | Status: DC | PRN
  Filled 2021-12-15: qty 2

## 2021-12-15 MED ORDER — SODIUM CHLORIDE 0.9 % IV SOLN: INTRAVENOUS | Status: DC

## 2021-12-15 MED ORDER — ONDANSETRON HCL 4 MG/2ML IJ SOLN
4.0000 mg | Freq: Four times a day (QID) | INTRAMUSCULAR | Status: DC | PRN
Start: 2021-12-15 — End: 2021-12-17
  Administered 2021-12-15 – 2021-12-16 (×2): 4 mg via INTRAVENOUS
  Filled 2021-12-15 (×2): qty 2

## 2021-12-15 MED ORDER — ACETAMINOPHEN 325 MG PO TABS
650.0000 mg | ORAL_TABLET | Freq: Four times a day (QID) | ORAL | Status: DC | PRN
Start: 2021-12-15 — End: 2021-12-17

## 2021-12-15 MED ORDER — ONDANSETRON HCL 4 MG PO TABS
4.0000 mg | ORAL_TABLET | Freq: Four times a day (QID) | ORAL | Status: DC | PRN
Start: 2021-12-15 — End: 2021-12-17

## 2021-12-15 MED ORDER — ENOXAPARIN SODIUM 60 MG/0.6ML IJ SOSY
50.0000 mg | PREFILLED_SYRINGE | INTRAMUSCULAR | Status: DC
  Administered 2021-12-15 – 2021-12-16 (×2): 50 mg via SUBCUTANEOUS
  Filled 2021-12-15 (×2): qty 0.6

## 2021-12-15 MED ORDER — KETOROLAC TROMETHAMINE 15 MG/ML IJ SOLN
15.0000 mg | Freq: Four times a day (QID) | INTRAMUSCULAR | Status: DC | PRN
Start: 2021-12-15 — End: 2021-12-17
  Administered 2021-12-15: 15 mg via INTRAVENOUS
  Filled 2021-12-15: qty 1

## 2021-12-15 MED ORDER — ACETAMINOPHEN 650 MG RE SUPP: 650.0000 mg | Freq: Four times a day (QID) | RECTAL | Status: DC | PRN

## 2021-12-15 MED ORDER — ONDANSETRON HCL 4 MG/2ML IJ SOLN
4.0000 mg | Freq: Once | INTRAMUSCULAR | Status: AC
  Administered 2021-12-15: 4 mg via INTRAVENOUS
  Filled 2021-12-15: qty 2

## 2021-12-15 MED ORDER — DEXTROSE IN LACTATED RINGERS 5 % IV SOLN: INTRAVENOUS | Status: DC

## 2021-12-15 MED ORDER — SODIUM CHLORIDE 0.9 % IV BOLUS
1000.0000 mL | Freq: Once | INTRAVENOUS | Status: AC
  Administered 2021-12-15: 1000 mL via INTRAVENOUS

## 2021-12-15 NOTE — ED Triage Notes (Signed)
Pt c/o abdominal pain accompanied by N/V since last thursday. Pt also reports bilateral flank pain ?

## 2021-12-15 NOTE — ED Notes (Signed)
Patient transported to CT 

## 2021-12-15 NOTE — H&P (Signed)
?History and Physical  ? ? ?Patient: Vickie [name] DPO:[id number] DOB: 01/09/1981 ?DOA: 12/15/2021 ?DOS: the patient was seen and examined on 12/15/2021 ?PCP: Nonnie Done., MD  ?Patient coming from: Home ? ?Chief Complaint:  ?Chief Complaint  ?Patient presents with  ? Abdominal Pain  ? ?HPI: Vickie [name] is a 41 y.o. female with medical history significant for post-partum depression, UTI, abnormal pap smear, hypertension, and anxiety.The patient states that she began having nausea and vomiting about a week prior to presentation. This became progressively worse. Diarrhea began on 12/14/2021.  ? ?She denies fevers, chills, cough, chest pain, hematemesis, hematochezia, coffee ground emesis, or melena. No neurological changes, rashes, sores, or lesions.  ? ?ED The patient was found to have a partial SBO on CT in the ED ? ?The patient was transferred to [facility] for admission for further evaluation and treatment. She will be given a clear liquid diet. She is receiving pain medication, antiemetics, and IV fluids ?Review of Systems: As mentioned in the history of present illness. All other systems reviewed and are negative. ?Past Medical History:  ?Diagnosis Date  ? Abnormal Pap smear   ? Anxiety   ? Broken arm   ? Left   ? Complication of anesthesia   ? Epidural- baby's HR decreased ; developed  fever  ? Fatigue 2008  ? H/O dyspareunia 01/23/11  ? H/O varicella   ? Hx of candidiasis   ? Hx: UTI (urinary tract infection)   ? Hypertension   ? Increased BMI   ? Irregular periods/menstrual cycles 2008  ? Postpartum depression   ? Vaginal lesion 2009   ? Painful  ? Vaginal pain 01/23/11  ? ?Past Surgical History:  ?Procedure Laterality Date  ? repair broken arm    ? TONSILLECTOMY    ? Age 1  ? TUBAL LIGATION    ? WISDOM TOOTH EXTRACTION    ? ?Social History:  reports that she has never smoked. She has never used smokeless tobacco. She reports current alcohol use of about 1.0 standard drink per week. She  reports that she does not use drugs. ? ?Allergies  ?Allergen Reactions  ? Morphine And Related Itching  ? ? ?Family History  ?Problem Relation Age of Onset  ? Hypertension Mother   ? Diabetes Mother   ? Cancer Mother   ?     skin  ? Leukemia Mother   ? Diabetes Father   ? Hypertension Father   ? COPD Father   ? Stroke Maternal Grandmother   ? ? ?Prior to Admission medications   ?Medication Sig Start Date End Date Taking? Authorizing Provider  ?Acetaminophen (TYLENOL PO) Take by mouth. PRN    [provider]  ?aspirin (BAYER LOW DOSE) 81 MG EC tablet Take 81 mg by mouth daily. Swallow whole.    [provider]  ?cholecalciferol (VITAMIN D3) 25 MCG (1000 UT) tablet Take 1,000 Units by mouth daily.    [provider]  ?Cyanocobalamin (B-12) 2000 MCG TABS Take 2,000 mg by mouth daily.    [provider]  ?hydrochlorothiazide (HYDRODIURIL) 12.5 MG tablet TAKE 1 TABLET BY MOUTH DAILY. **NEEDS APT FOR REFILLS** 12/06/20   Mayer Masker, PA-C  ?losartan (COZAAR) 100 MG tablet Take 0.5 tablets (50 mg total) by mouth daily. 03/29/20   Mayer Masker, PA-C  ?metFORMIN (GLUCOPHAGE) 500 MG tablet TAKE 1 TABLET (500 MG TOTAL) BY MOUTH 2 (TWO) TIMES DAILY WITH A MEAL. **NEEDS APT FOR REFILLS** 12/06/20   Abonza,  Kandis Cocking, PA-C  ?Multiple Vitamin (MULTIVITAMIN) capsule Take 1 capsule by mouth daily.    [provider]  ?pantoprazole (PROTONIX) 40 MG tablet TAKE 1 TABLET BY MOUTH EVERY DAY 09/21/20   Mayer Masker, PA-C  ? ? ?Physical Exam: ?Vitals:  ? 12/15/21 1200 12/15/21 1500 12/15/21 1600 12/15/21 1724  ?BP: 129/71 127/78 126/84 127/84  ?Pulse: 90 87 82 85  ?Resp: 19 15 20 18   ?Temp:    98.5 ?F (36.9 ?C)  ?TempSrc:    Oral  ?SpO2: 96% 92% 94% 97%  ?Weight:      ?Height:      ? ?Exam: ? ?Constitutional:  ?The patient is awake, alert, and oriented x 3. No acute distress. ?Eyes:  ?pupils and irises appear normal ?Normal lids and conjunctivae ?ENMT:  ?grossly normal hearing  ?Lips appear  normal ?external ears, nose appear normal ?Oropharynx: mucosa, tongue,posterior pharynx appear normal ?Neck:  ?neck appears normal, no masses, normal ROM, supple ?no thyromegaly ?Respiratory:  ?No increased work of breathing. ?No wheezes, rales, or rhonchi ?No tactile fremitus ?Cardiovascular:  ?Regular rate and rhythm ?No murmurs, ectopy, or gallups. ?No lateral PMI. No thrills. ?Abdomen:  ?Abdomen is soft, diffusely tender, and mildly distended ?No hernias, masses, or organomegaly ?Hypo-active bowel sounds.  ?Musculoskeletal:  ?No cyanosis, clubbing, or edema ?Skin:  ?No rashes, lesions, ulcers ?palpation of skin: no induration or nodules ?Neurologic:  ?CN 2-12 intact ?Sensation all 4 extremities intact ?Psychiatric:  ?Mental status ?Mood, affect appropriate ?Orientation to person, place, time  ?judgment and insight appear intact ? ?Data Reviewed: ? ?CBC, CMP, CT abdomen and pelvis ? ?Assessment and Plan: ?Problem  ?Sbo (Small Bowel Obstruction) (Hcc)  ?Nonalcoholic Hepatosteatosis  ?Htn, Goal Below 130/80  ?Obesity, Class III, Bmi 40-49.9 (Morbid Obesity) (Hcc)  ? ?Partial SBO: Patient is passing stool and flatus. She will receive IV fluids, pain medication, and antiemetics. She is on a clear liquid diet. Will advance as tolerated.  ? ?Morbid obesity: Complicates all cares. Recommend supervised weight loss through sensible diet modification and increased activity. ? ?Nonalcoholic hepatosteatosis: Due to obesity. Weight loss is indicated.  ? ?Hypertension: Pt is currently on prn IV hydralazine. Monitor and restart home meds as possible. ? ?I have seen and examined this patient myself. I have spent 76 minutes in her evaluation and admission. ? ? Advance Care Planning:   Code Status: Full Code  ? ?Consults: None ? ?Family Communication: Mother is at bedside ? ?Severity of Illness: ?The appropriate patient status for this patient is OBSERVATION. Observation status is judged to be reasonable and necessary in order to  provide the required intensity of service to ensure the patient's safety. The patient's presenting symptoms, physical exam findings, and initial radiographic and laboratory data in the context of their medical condition is felt to place them at decreased risk for further clinical deterioration. Furthermore, it is anticipated that the patient will be medically stable for discharge from the hospital within 2 midnights of admission.  ? ?Author: ?Altha Sweitzer, DO ?12/15/2021 6:57 PM ? ?For on call review [email or web address].  ?

## 2021-12-15 NOTE — Progress Notes (Signed)
Plan of Care Note for accepted transfer ? ? ?Patient: Vickie [name] MRN: [id number]   DOA: 12/15/2021 ? ?Facility requesting transfer: [facility] ?Requesting Provider: Dr. Deretha Emory ?Reason for transfer: pSBO ?Facility course: 41 yo F presenting with N/V/crampy abdominal pain. W/u reveal a partial SBO. Case discussed w/ general surgery. Rec'd admission to [facility]. No NGT right now. ? ?Plan of care: ?The patient is accepted for admission to Med-surg  unit, at [facility]..  ?While patient is holding at [facility], the ED staff will remain responsible for medical decision making/orders. Upon arrival to [facility], [facility] was assume care.  ? ?Author: ?Teddy Spike, DO ?12/15/2021 ? ?Check [email or web address] for on-call coverage. ? ?Nursing staff, Please call [facility] Admits & Consults System-Wide number on Amion as soon as patient's arrival, so appropriate admitting provider can evaluate the pt. ? ?

## 2021-12-15 NOTE — ED Provider Notes (Addendum)
?[facility] EMERGENCY DEPT ?Provider Note ? ? ?CSN: [id number] ?Arrival date & time: 12/15/21  8299 ? ?  ? ?History ? ?Chief Complaint  ?Patient presents with  ? Abdominal Pain  ? ? ?Vickie [name] is a 41 y.o. female. ? ?Patient with onset of anterior abdominal pain radiating to the lower part of the back and rectal area starting a week ago.  3 days ago started with nausea and vomiting.  Diarrhea started yesterday and is watery.  Patient has not been on antibiotics recently.  Never anything like this before.  Has an allergy to morphine.  Denies any fevers or any upper respiratory symptoms.  Denies any blood in the vomit or the diarrhea. ? ?Past medical history is significant for postpartum depression history of urinary tract infections abnormal Pap smear hypertension anxiety. ? ? ?  ? ?Home Medications ?Prior to Admission medications   ?Medication Sig Start Date End Date Taking? Authorizing Provider  ?Acetaminophen (TYLENOL PO) Take by mouth. PRN    [provider]  ?aspirin (BAYER LOW DOSE) 81 MG EC tablet Take 81 mg by mouth daily. Swallow whole.    [provider]  ?cholecalciferol (VITAMIN D3) 25 MCG (1000 UT) tablet Take 1,000 Units by mouth daily.    [provider]  ?Cyanocobalamin (B-12) 2000 MCG TABS Take 2,000 mg by mouth daily.    [provider]  ?hydrochlorothiazide (HYDRODIURIL) 12.5 MG tablet TAKE 1 TABLET BY MOUTH DAILY. **NEEDS APT FOR REFILLS** 12/06/20   Mayer Masker, PA-C  ?losartan (COZAAR) 100 MG tablet Take 0.5 tablets (50 mg total) by mouth daily. 03/29/20   Mayer Masker, PA-C  ?metFORMIN (GLUCOPHAGE) 500 MG tablet TAKE 1 TABLET (500 MG TOTAL) BY MOUTH 2 (TWO) TIMES DAILY WITH A MEAL. **NEEDS APT FOR REFILLS** 12/06/20   Mayer Masker, PA-C  ?Multiple Vitamin (MULTIVITAMIN) capsule Take 1 capsule by mouth daily.    [provider]  ?pantoprazole (PROTONIX) 40 MG tablet TAKE 1 TABLET BY MOUTH EVERY DAY 09/21/20   Mayer Masker, PA-C  ?   ? ?Allergies    ?Morphine and related   ? ?Review of Systems   ?Review of Systems  ?Gastrointestinal:  Positive for abdominal pain, diarrhea, nausea and vomiting.  ?Musculoskeletal:  Positive for back pain.  ? ?Physical Exam ?Updated Vital Signs ?BP 126/76   Pulse 86   Temp 97.9 ?F (36.6 ?C) (Oral)   Resp 16   Ht 1.575 m (5\' 2" )   Wt 105.2 kg   LMP 11/21/2021 (Approximate)   SpO2 97%   BMI 42.43 kg/m?  ?Physical Exam ?Vitals and nursing note reviewed.  ?Constitutional:   ?   General: She is not in acute distress. ?   Appearance: Normal appearance. She is well-developed. She is obese.  ?HENT:  ?   Head: Normocephalic and atraumatic.  ?Eyes:  ?   Extraocular Movements: Extraocular movements intact.  ?   Conjunctiva/sclera: Conjunctivae normal.  ?   Pupils: Pupils are equal, round, and reactive to light.  ?Cardiovascular:  ?   Rate and Rhythm: Normal rate and regular rhythm.  ?   Heart sounds: No murmur heard. ?Pulmonary:  ?   Effort: Pulmonary effort is normal. No respiratory distress.  ?   Breath sounds: Normal breath sounds.  ?Abdominal:  ?   General: There is no distension.  ?   Palpations: Abdomen is soft.  ?   Tenderness: There is no abdominal tenderness. There is no guarding.  ?Musculoskeletal:     ?  General: No swelling or tenderness.  ?   Cervical back: Neck supple.  ?Skin: ?   General: Skin is warm and dry.  ?   Capillary Refill: Capillary refill takes less than 2 seconds.  ?Neurological:  ?   General: No focal deficit present.  ?   Mental Status: She is alert and oriented to person, place, and time.  ?Psychiatric:     ?   Mood and Affect: Mood normal.  ? ? ?ED Results / Procedures / Treatments   ?Labs ?(all labs ordered are listed, but only abnormal results are displayed) ?Labs Reviewed  ?COMPREHENSIVE METABOLIC PANEL - Abnormal; Notable for the following components:  ?    Result Value  ? Sodium 134 (*)   ? Chloride 95 (*)   ? Glucose, Bld 231 (*)   ? All other components within  normal limits  ?CBC - Abnormal; Notable for the following components:  ? RBC 5.15 (*)   ? Hemoglobin 15.6 (*)   ? All other components within normal limits  ?URINALYSIS, ROUTINE W REFLEX MICROSCOPIC - Abnormal; Notable for the following components:  ? Specific Gravity, Urine >1.046 (*)   ? Glucose, UA 100 (*)   ? Ketones, ur TRACE (*)   ? Protein, ur 30 (*)   ? All other components within normal limits  ?LIPASE, BLOOD  ?PREGNANCY, URINE  ?DIFFERENTIAL  ? ? ?EKG ?None ? ?Radiology ?CT Abdomen Pelvis W Contrast ? ?Result Date: 12/15/2021 ?CLINICAL DATA:  Abdominal pain, acute, nonlocalized EXAM: CT ABDOMEN AND PELVIS WITH CONTRAST TECHNIQUE: Multidetector CT imaging of the abdomen and pelvis was performed using the standard protocol following bolus administration of intravenous contrast. RADIATION DOSE REDUCTION: This exam was performed according to the departmental dose-optimization program which includes automated exposure control, adjustment of the mA and/or kV according to patient size and/or use of iterative reconstruction technique. CONTRAST:  OMNIPAQUE IOHEXOL 300 MG/ML  SOLN COMPARISON:  CT abdomen and pelvis from 05/17/2016. FINDINGS: Lower chest: No acute abnormality. Hepatobiliary: Hepatomegaly. Diffuse hypoattenuation of the liver, compatible with hepatic steatosis also seen on prior noncontrast CT. No focal liver lesion. Unremarkable appearance of the gallbladder without evidence of biliary dilation. Pancreas: Unremarkable. No pancreatic ductal dilatation or surrounding inflammatory changes. Spleen: Borderline splenomegaly. Adrenals/Urinary Tract: Adrenal glands are unremarkable. Kidneys are normal, without renal calculi, focal lesion, or hydronephrosis. Bladder is unremarkable. Stomach/Bowel: Mildly dilated small bowel loops in the mid and left aspect of the abdomen, measuring up to 3.4 cm. The small bowel loops gradually transition to decompressed small bowel in the right lower quadrant without  convincing transition point. Bowel wall appears to be enhancing normally without evidence of pneumatosis or portal venous gas. Vascular/Lymphatic: Aortic atherosclerosis. Mildly prominent mesenteric lymph nodes. Reproductive: Uterus and bilateral adnexa are unremarkable. Other: No abdominal wall hernia or abnormality. No abdominopelvic ascites. Musculoskeletal: No evidence of acute abnormality. IMPRESSION: 1. Findings concerning for early or partial small bowel obstruction, as detailed above. 2. Mildly prominent mesenteric lymph nodes, nonspecific. 3. Hepatomegaly and hepatic steatosis. 4. Borderline splenomegaly. Electronically Signed   By: Feliberto Harts M.D.   On: 12/15/2021 10:35   ? ?Procedures ?Procedures  ? ? ?Medications Ordered in ED ?Medications  ?0.9 %  sodium chloride infusion (has no administration in time range)  ?HYDROmorphone (DILAUDID) injection 1 mg (has no administration in time range)  ?ondansetron [facility]) injection 4 mg (4 mg Intravenous Given 12/15/21 0954)  ?sodium chloride 0.9 % bolus 1,000 mL (1,000 mLs Intravenous New Bag/Given 12/15/21  [id number])  ?iohexol (OMNIPAQUE) 300 MG/ML solution 100 mL (100 mLs Intravenous Contrast Given 12/15/21 1012)  ? ? ?ED Course/ Medical Decision Making/ A&P ?  ?                        ?Medical Decision Making ?Amount and/or Complexity of Data Reviewed ?Labs: ordered. ?Radiology: ordered. ? ?Risk ?Prescription drug management. ?Decision regarding hospitalization. ? ? ?We will give patient IV fluids check CBC lipase complete metabolic panel and urinalysis pregnancy test.  And will get CT scan of the abdomen with contrast. ? ?CBC is back and white count is 9.4 hemoglobin 15.6 reassuring.  Rest of labs still pending.  Patient's urinalysis showed some dehydration but otherwise not consistent with urinary tract infection.  Pregnancy test negative lipase normal patient's complete metabolic panel with some mild hyponatremia with a sodium of 134 CO2 is 27 blood sugar up  some at 231.  Patient may have some undiagnosed diabetes.  Liver function test normal.  CBC no leukocytosis hemoglobin 15.6 as mentioned above.  CT scan of the abdomen and is concerning for early or partial smal

## 2021-12-16 LAB — CBC
HCT: 43.6 % (ref 36.0–46.0)
Hemoglobin: 14.6 g/dL (ref 12.0–15.0)
MCH: 31.1 pg (ref 26.0–34.0)
MCHC: 33.5 g/dL (ref 30.0–36.0)
MCV: 92.8 fL (ref 80.0–100.0)
Platelets: 226 10*3/uL (ref 150–400)
RBC: 4.7 MIL/uL (ref 3.87–5.11)
RDW: 13.2 % (ref 11.5–15.5)
WBC: 6.6 10*3/uL (ref 4.0–10.5)
nRBC: 0 % (ref 0.0–0.2)

## 2021-12-16 LAB — COMPREHENSIVE METABOLIC PANEL
ALT: 36 U/L (ref 0–44)
AST: 31 U/L (ref 15–41)
Albumin: 3.5 g/dL (ref 3.5–5.0)
Alkaline Phosphatase: 65 U/L (ref 38–126)
Anion gap: 7 (ref 5–15)
BUN: 14 mg/dL (ref 6–20)
CO2: 25 mmol/L (ref 22–32)
Calcium: 8.8 mg/dL — ABNORMAL LOW (ref 8.9–10.3)
Chloride: 103 mmol/L (ref 98–111)
Creatinine, Ser: 0.65 mg/dL (ref 0.44–1.00)
GFR, Estimated: >60 mL/min (ref >60)
Glucose, Bld: 229 mg/dL — ABNORMAL HIGH (ref 70–99)
Potassium: 3.6 mmol/L (ref 3.5–5.1)
Sodium: 135 mmol/L (ref 135–145)
Total Bilirubin: 0.7 mg/dL (ref 0.3–1.2)
Total Protein: 6.6 g/dL (ref 6.5–8.1)

## 2021-12-16 LAB — HIV ANTIBODY (ROUTINE TESTING W REFLEX): HIV Screen 4th Generation wRfx: NONREACTIVE

## 2021-12-16 NOTE — Progress Notes (Signed)
Transition of Care (TOC) Screening Note ? ?Patient Details  ?Name: Vickie [name] ?Date of Birth: 19-Apr-1981 ? ?Transition of Care (TOC) CM/SW Contact:    ?Ewing Schlein, LCSW ?Phone Number: ?12/16/2021, 10:21 AM ? ?Transition of Care Department [facility]) has reviewed patient and no TOC needs have been identified at this time. We will continue to monitor patient advancement through interdisciplinary progression rounds. If new patient transition needs arise, please place a TOC consult. ?

## 2021-12-16 NOTE — Progress Notes (Signed)
?PROGRESS NOTE ? ?Emree Sharaf CORTES-ALTAMIRANO KGM:[id number] DOB: Jan 29, 1981 DOA: 12/15/2021 ?PCP: Nonnie Done., MD ? ?Brief History   ?Vickie [name] is a 41 y.o. female with medical history significant for post-partum depression, UTI, abnormal pap smear, hypertension, and anxiety.The patient states that she began having nausea and vomiting about a week prior to presentation. This became progressively worse. Diarrhea began on 12/14/2021.  ?  ?She denies fevers, chills, cough, chest pain, hematemesis, hematochezia, coffee ground emesis, or melena. No neurological changes, rashes, sores, or lesions.  ?  ?ED The patient was found to have a partial SBO on CT in the ED ?  ?The patient was transferred to [facility] for admission for further evaluation and treatment. She will be given a clear liquid diet. She is receiving pain medication, antiemetics, and IV fluids ? ?General surgery has been consulted. ? ?Today the patient is feeling better. She is tolerating the clear liquid diet.  ? ?Consultants  ?General surgery ? ?Procedures  ?None ? ?Antibiotics  ? ?Anti-infectives (From admission, onward)  ? ? None  ? ?  ? ?Subjective  ?The patient is sitting up at bedside. She states that she is feeling better and passing flatus. She still has some abdominal discomfort. ? ?Objective  ? ?Vitals:  ?Vitals:  ? 12/16/21 0919 12/16/21 1326  ?BP: (!) 133/96 136/80  ?Pulse: 85 73  ?Resp: 16 15  ?Temp: 97.7 ?F (36.5 ?C) 97.6 ?F (36.4 ?C)  ?SpO2: 97% 97%  ? ? ?Exam: ? ?Constitutional:  ?The patient is awake, alert, and oriented x 3. No acute distress. ?Respiratory:  ?No increased work of breathing. ?No wheezes, rales, or rhonchi ?No tactile fremitus ?Cardiovascular:  ?Regular rate and rhythm ?No murmurs, ectopy, or gallups. ?No lateral PMI. No thrills. ?Abdomen:  ?Abdomen is soft, non-tender, non-distended ?No hernias, masses, or organomegaly ?Normoactive bowel sounds.  ?Musculoskeletal:  ?No cyanosis, clubbing, or edema ?Skin:  ?No  rashes, lesions, ulcers ?palpation of skin: no induration or nodules ?Neurologic:  ?CN 2-12 intact ?Sensation all 4 extremities intact ?Psychiatric:  ?Mental status ?Mood, affect appropriate ?Orientation to person, place, time  ?judgment and insight appear intact ? ? ?I have personally reviewed the following:  ? ?Today's Data  ?Vitals ? ?Lab Data  ?CMP ?CBC ? ?Micro Data  ? ? ?Imaging  ?CT abdomen and pelvis ? ?Cardiology Data  ? ? ?Other Data  ? ? ?Scheduled Meds: ? enoxaparin (LOVENOX) injection  50 mg Subcutaneous Q24H  ? ?Continuous Infusions: ? ?Principal Problem: ?  SBO (small bowel obstruction) (HCC) ?Active Problems: ?  HTN, goal below 130/80 ?  Obesity, Class III, BMI 40-49.9 (morbid obesity) (HCC) ?  Nonalcoholic hepatosteatosis ? ? ?A & P  ?Partial SBO: Patient is passing stool and flatus. She will receive IV fluids, pain medication, and antiemetics. General surgery has been consulted. I appreciate their help. She has been tolerating her clear liquid diet. I will advance her to a full liquid diet. ?  ?Morbid obesity: Complicates all cares. Recommend supervised weight loss through sensible diet modification and increased activity. ?  ?Nonalcoholic hepatosteatosis: Due to obesity. Weight loss is indicated.  ?  ?Hypertension: Pt is currently on prn IV hydralazine. Monitor and restart home meds as possible. ? ?I have seen and examined this patient myself. I have spent 34 minutes in her evaluation and care. ? ?DVT prophylaxis: Lovenox ?Code Status: Full Code ?Family Communication: Patient's mother is at bedside. ?Disposition Plan: Home  ? ? ?Janica Eldred, DO ?[facility] ?Direct contact:  see [email or web address]  ?7PM-7AM contact night coverage as above ?12/16/2021, 5:25 PM  LOS: 0 days  ? LOS: 0 days  ? ? ? ? ? ? ?  ?

## 2021-12-16 NOTE — Plan of Care (Cosign Needed)
Completed during shift assessment ?

## 2021-12-16 NOTE — Consult Note (Signed)
? ? ? ?Abbagale L CORTES-ALTAMIRANO ?15-Oct-1980  ?[id number].   ? ?Requesting MD: Dr. Fran Lowes ?Chief Complaint/Reason for Consult: SBO ? ?HPI: Vickie [name] is a 41 y.o. female who presented to the ED on 4/13 for abdominal pain, nausea, vomiting and diarrhea. ? ?Patient reports that last week she began having some lower back pain.  On Monday, 4/10, the pain began radiating into her lower abdomen.  By Tuesday, 4/11, she began having constant nausea with associated vomiting.  Wednesday, 4/12, she began having diarrhea.  Her abdominal pain, N/V/D persisted into Thursday, 4/13, prompting a visit to the ED.  In the ED she was afebrile without tachycardia or hypotension.  WBC 6.6.  CT A/P with mildly dilated small bowel loops in the mid and left aspect of the abdomen, measuring 3.4 cm, gradually transitioning to decompressed small bowel in the RLQ without convincing transition point but concerning for early or partial small bowel obstruction.  Patient was transferred to [facility] for further evaluation.  [facility] admitted. ? ?She reports since admission her abdominal pain, nausea and vomiting have resolved.  She has not had diarrhea since yesterday/Thursday.  She is passing flatus.  Her last normal BM was on Wednesday morning.  She denies history of similar symptoms in the past.  Her daughter did just recently have a GI bug last week.  Patient has a history of laparoscopic tubal ligation.  No other abdominal surgeries.  She takes a baby ASA but is not on any other antiplatelet agents and denies any home anticoagulation medications.  ? ?ROS: ?Review of Systems  ?Constitutional:  Negative for chills and fever.  ?Respiratory:  Negative for shortness of breath.   ?Cardiovascular:  Negative for chest pain.  ?Gastrointestinal:  Positive for abdominal pain, diarrhea, nausea and vomiting. Negative for blood in stool, constipation and melena.  ?Genitourinary: Negative.   ?Musculoskeletal:  Positive for back pain.  ? ?Family  History  ?Problem Relation Age of Onset  ? Hypertension Mother   ? Diabetes Mother   ? Cancer Mother   ?     skin  ? Leukemia Mother   ? Diabetes Father   ? Hypertension Father   ? COPD Father   ? Stroke Maternal Grandmother   ? ? ?Past Medical History:  ?Diagnosis Date  ? Abnormal Pap smear   ? Anxiety   ? Broken arm   ? Left   ? Complication of anesthesia   ? Epidural- baby's HR decreased ; developed  fever  ? Fatigue 2008  ? H/O dyspareunia 01/23/11  ? H/O varicella   ? Hx of candidiasis   ? Hx: UTI (urinary tract infection)   ? Hypertension   ? Increased BMI   ? Irregular periods/menstrual cycles 2008  ? Postpartum depression   ? Vaginal lesion 2009   ? Painful  ? Vaginal pain 01/23/11  ? ? ?Past Surgical History:  ?Procedure Laterality Date  ? repair broken arm    ? TONSILLECTOMY    ? Age 40  ? TUBAL LIGATION    ? WISDOM TOOTH EXTRACTION    ? ? ?Social History:  reports that she has never smoked. She has never used smokeless tobacco. She reports current alcohol use of about 1.0 standard drink per week. She reports that she does not use drugs. ? ?Allergies:  ?Allergies  ?Allergen Reactions  ? Morphine And Related Itching  ? ? ?Medications Prior to Admission  ?Medication Sig Dispense Refill  ? aspirin EC 81 MG tablet Take  81 mg by mouth every evening. Swallow whole.    ? Cholecalciferol (VITAMIN D-3) 125 MCG (5000 UT) TABS Take 10,000 Units by mouth every morning.    ? Cyanocobalamin (B-12 PO) Take 1 tablet by mouth every morning.    ? FIBER PO Take by mouth every morning.    ? hydrochlorothiazide (HYDRODIURIL) 12.5 MG tablet TAKE 1 TABLET BY MOUTH DAILY. **NEEDS APT FOR REFILLS** (Patient taking differently: Take 12.5 mg by mouth every morning.) 15 tablet 0  ? losartan (COZAAR) 100 MG tablet Take 0.5 tablets (50 mg total) by mouth daily. (Patient taking differently: Take 50 mg by mouth every morning.) 30 tablet 0  ? metFORMIN (GLUCOPHAGE) 500 MG tablet TAKE 1 TABLET (500 MG TOTAL) BY MOUTH 2 (TWO) TIMES DAILY WITH  A MEAL. **NEEDS APT FOR REFILLS** (Patient taking differently: Take 500 mg by mouth 2 (two) times daily with a meal.) 30 tablet 0  ? Multiple Vitamin (MULTIVITAMIN WITH MINERALS) TABS tablet Take 2 tablets by mouth every morning.    ? Multiple Vitamins-Minerals (ZINC PO) Take 1 tablet by mouth every evening.    ? pantoprazole (PROTONIX) 40 MG tablet TAKE 1 TABLET BY MOUTH EVERY DAY (Patient taking differently: 40 mg every morning.) 60 tablet 0  ? Acetaminophen (TYLENOL PO) Take by mouth. PRN    ? ? ? ?Physical Exam: ?Blood pressure (!) 133/96, pulse 85, temperature 97.7 ?F (36.5 ?C), temperature source Oral, resp. rate 16, height 5\' 2"  (1.575 m), weight 105.2 kg, last menstrual period 11/21/2021, SpO2 97 %. ?General: pleasant, WD/WN white female who is laying in bed in NAD ?HEENT: head is normocephalic, atraumatic.  Sclera are noninjected.  PERRL.  Ears and nose without any masses or lesions.  Mouth is pink and moist. Dentition fair ?Heart: regular, rate, and rhythm ?Lungs: CTAB, no wheezes, rhonchi, or rales noted.  Respiratory effort nonlabored ?Abd:  Soft, ND, mild lower abdominal tenderness without peritonitis. Otherwise NT. +BS. No masses, hernias, or organomegaly ?MS: no BUE/BLE edema ?Skin: warm and dry with no masses, lesions, or rashes ?Psych: A&Ox4 with an appropriate affect ?Neuro: cranial nerves grossly intact, thought process intact, moves all extremities, gait not assessed ? ?Results for orders placed or performed during the hospital encounter of 12/15/21 (from the past 48 hour(s))  ?Lipase, blood     Status: None  ? Collection Time: 12/15/21  9:11 AM  ?Result Value Ref Range  ? Lipase 26 11 - 51 U/L  ?  Comment: Performed at [facility], [address], [place], [place] [address]  ?Comprehensive metabolic panel     Status: Abnormal  ? Collection Time: 12/15/21  9:11 AM  ?Result Value Ref Range  ? Sodium 134 (L) 135 - 145 mmol/L  ? Potassium 4.1 3.5 - 5.1 mmol/L  ? Chloride 95 (L)  98 - 111 mmol/L  ? CO2 27 22 - 32 mmol/L  ? Glucose, Bld 231 (H) 70 - 99 mg/dL  ?  Comment: Glucose reference range applies only to samples taken after fasting for at least 8 hours.  ? BUN 14 6 - 20 mg/dL  ? Creatinine, Ser 0.73 0.44 - 1.00 mg/dL  ? Calcium 10.0 8.9 - 10.3 mg/dL  ? Total Protein 7.8 6.5 - 8.1 g/dL  ? Albumin 4.7 3.5 - 5.0 g/dL  ? AST 27 15 - 41 U/L  ? ALT 36 0 - 44 U/L  ? Alkaline Phosphatase 75 38 - 126 U/L  ? Total Bilirubin 0.7 0.3 - 1.2 mg/dL  ?  GFR, Estimated >60 >60 mL/min  ?  Comment: (NOTE) ?Calculated using the CKD-EPI Creatinine Equation (2021) ?  ? Anion gap 12 5 - 15  ?  Comment: Performed at [facility], [address], [place], [place] [address]  ?CBC     Status: Abnormal  ? Collection Time: 12/15/21  9:11 AM  ?Result Value Ref Range  ? WBC 9.4 4.0 - 10.5 K/uL  ? RBC 5.15 (H) 3.87 - 5.11 MIL/uL  ? Hemoglobin 15.6 (H) 12.0 - 15.0 g/dL  ? HCT 45.9 36.0 - 46.0 %  ? MCV 89.1 80.0 - 100.0 fL  ? MCH 30.3 26.0 - 34.0 pg  ? MCHC 34.0 30.0 - 36.0 g/dL  ? RDW 13.1 11.5 - 15.5 %  ? Platelets 272 150 - 400 K/uL  ? nRBC 0.0 0.0 - 0.2 %  ?  Comment: Performed at [facility], [address] [place], [place] [address]  ?Differential     Status: None  ? Collection Time: 12/15/21  9:11 AM  ?Result Value Ref Range  ? Neutrophils Relative % 73 %  ? Neutro Abs 6.6 1.7 - 7.7 K/uL  ? Lymphocytes Relative 20 %  ? Lymphs Abs 1.8 0.7 - 4.0 K/uL  ? Monocytes Relative 7 %  ? Monocytes Absolute 0.7 0.1 - 1.0 K/uL  ? Eosinophils Relative 0 %  ? Eosinophils Absolute 0.0 0.0 - 0.5 K/uL  ? Basophils Relative 0 %  ? Basophils Absolute 0.0 0.0 - 0.1 K/uL  ? Immature Granulocytes 0 %  ? Abs Immature Granulocytes 0.02 0.00 - 0.07 K/uL  ?  Comment: Performed at [facility], [address], [place], [place] [address]  ?Urinalysis, Routine w reflex microscopic Urine, Clean Catch     Status: Abnormal  ? Collection Time: 12/15/21 10:40 AM  ?Result Value Ref Range   ? Color, Urine YELLOW YELLOW  ? APPearance CLEAR CLEAR  ? Specific Gravity, Urine >1.046 (H) 1.005 - 1.030  ? pH 5.5 5.0 - 8.0  ? Glucose, UA 100 (A) NEGATIVE mg/dL  ? Hgb urine dipstick NEGATIVE NEGATIVE

## 2021-12-17 LAB — BASIC METABOLIC PANEL
Anion gap: 8 (ref 5–15)
BUN: 10 mg/dL (ref 6–20)
CO2: 26 mmol/L (ref 22–32)
Calcium: 9.1 mg/dL (ref 8.9–10.3)
Chloride: 105 mmol/L (ref 98–111)
Creatinine, Ser: 0.59 mg/dL (ref 0.44–1.00)
GFR, Estimated: >60 mL/min (ref >60)
Glucose, Bld: 168 mg/dL — ABNORMAL HIGH (ref 70–99)
Potassium: 3.5 mmol/L (ref 3.5–5.1)
Sodium: 139 mmol/L (ref 135–145)

## 2021-12-17 LAB — CBC WITH DIFFERENTIAL/PLATELET
Abs Immature Granulocytes: 0.03 10*3/uL (ref 0.00–0.07)
Basophils Absolute: 0 10*3/uL (ref 0.0–0.1)
Basophils Relative: 0 %
Eosinophils Absolute: 0.1 10*3/uL (ref 0.0–0.5)
Eosinophils Relative: 2 %
HCT: 41.8 % (ref 36.0–46.0)
Hemoglobin: 13.6 g/dL (ref 12.0–15.0)
Immature Granulocytes: 1 %
Lymphocytes Relative: 39 %
Lymphs Abs: 2.5 10*3/uL (ref 0.7–4.0)
MCH: 30.1 pg (ref 26.0–34.0)
MCHC: 32.5 g/dL (ref 30.0–36.0)
MCV: 92.5 fL (ref 80.0–100.0)
Monocytes Absolute: 0.6 10*3/uL (ref 0.1–1.0)
Monocytes Relative: 10 %
Neutro Abs: 3.2 10*3/uL (ref 1.7–7.7)
Neutrophils Relative %: 48 %
Platelets: 236 10*3/uL (ref 150–400)
RBC: 4.52 MIL/uL (ref 3.87–5.11)
RDW: 12.9 % (ref 11.5–15.5)
WBC: 6.5 10*3/uL (ref 4.0–10.5)
nRBC: 0 % (ref 0.0–0.2)
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
## 2021-12-17 NOTE — Plan of Care (Signed)
Instructions were reviewed with patient. All questions were answered. Patient was transported to main entrance by wheelchair. ° °

## 2021-12-18 NOTE — Discharge Summary (Signed)
?Physician Discharge Summary ?  ?Patient: Vickie [name] MRN: [id number] DOB: 1981/05/12  ?Admit date:     12/15/2021  ?Discharge date: 12/17/2021  ?Discharge Physician: Perfecto Purdy  ? ?PCP: Nonnie Done., MD  ? ?Recommendations at discharge:  ? ? Discharge to home. ?Follow up with PCP in 7-10 days. ? ?Discharge Diagnoses: ?Principal Problem: ?  SBO (small bowel obstruction) (HCC) ?Active Problems: ?  HTN, goal below 130/80 ?  Obesity, Class III, BMI 40-49.9 (morbid obesity) (HCC) ?  Nonalcoholic hepatosteatosis ? ?Resolved Problems: ?  * No resolved hospital problems. * ? ?Hospital Course: ?Vickie [name] is a 41 y.o. female with medical history significant for post-partum depression, UTI, abnormal pap smear, hypertension, and anxiety.The patient states that she began having nausea and vomiting about a week prior to presentation. This became progressively worse. Diarrhea began on 12/14/2021.  ?  ?She denies fevers, chills, cough, chest pain, hematemesis, hematochezia, coffee ground emesis, or melena. No neurological changes, rashes, sores, or lesions.  ?  ?ED The patient was found to have a partial SBO on CT in the ED. ?  ?The patient was transferred to [facility] for admission for further evaluation and treatment. She will be given a clear liquid diet. She is receiving pain medication, antiemetics, and IV fluids ?  ?General surgery has been consulted. Dr. Cliffton Asters felt that the patient actually had gastroenteritis rather than SBO ?  ?She was initially kept NPO. Then her diet was slowly advanced. Today the patient has been tolerating a normal diet. She is appropriate for discharge to home. ? ?Assessment and Plan: ?Partial SBO: Patient is passing stool and flatus. She will receive IV fluids, pain medication, and antiemetics. General surgery has been consulted. They feel that she actually has gastroenteritis. I appreciate their help. She has been tolerating her full liquid diet. She was advanced to a  regular diet. She is tolerating this diet well. She will be discharged to home. ?  ?Morbid obesity: Complicates all cares. Recommend supervised weight loss through sensible diet modification and increased activity. ?  ?Nonalcoholic hepatosteatosis: Due to obesity. Weight loss is indicated.  ?  ?Hypertension: Pt is currently on prn IV hydralazine. Monitor and restart home meds as possible. ? ?Consultants: General Surgery ?Procedures performed: None  ?Disposition: Home ?Diet recommendation:  ?Discharge Diet Orders (From admission, onward)  ? ?  Start     Ordered  ? 12/17/21 0000  Diet - low sodium heart healthy       ? 12/17/21 1347  ? ?  ?  ? ?  ? ?Regular diet ?DISCHARGE MEDICATION: ?Allergies as of 12/17/2021   ? ?   Reactions  ? Morphine And Related Itching  ? ?  ? ?  ?Medication List  ?  ? ?TAKE these medications   ? ?acetaminophen 500 MG tablet ?Commonly known as: TYLENOL ?Take 1,000 mg by mouth 2 (two) times daily as needed (pain). ?  ?aspirin EC 81 MG tablet ?Take 81 mg by mouth at bedtime. Swallow whole. ?  ?FIBER THERAPY PO ?Take 1 tablet by mouth at bedtime. ?  ?hydrochlorothiazide 12.5 MG tablet ?Commonly known as: HYDRODIURIL ?TAKE 1 TABLET BY MOUTH DAILY. **NEEDS APT FOR REFILLS** ?What changed: See the new instructions. ?  ?hyoscyamine 0.125 MG Tbdp disintergrating tablet ?Commonly known as: ANASPAZ ?Place 0.125 mg under the tongue daily as needed (nausea). ?  ?ibuprofen 200 MG tablet ?Commonly known as: ADVIL ?Take 400 mg by mouth once. ?  ?losartan 100 MG tablet ?Commonly  known as: COZAAR ?Take 0.5 tablets (50 mg total) by mouth daily. ?What changed: when to take this ?  ?metFORMIN 500 MG tablet ?Commonly known as: GLUCOPHAGE ?TAKE 1 TABLET (500 MG TOTAL) BY MOUTH 2 (TWO) TIMES DAILY WITH A MEAL. **NEEDS APT FOR REFILLS** ?What changed:  ?when to take this ?additional instructions ?  ?multivitamin with minerals Tabs tablet ?Take 2 tablets by mouth every morning. ?  ?pantoprazole 40 MG tablet ?Commonly  known as: PROTONIX ?TAKE 1 TABLET BY MOUTH EVERY DAY ?What changed:  ?how to take this ?when to take this ?  ?Vitamin B-12 5000 MCG Tbdp ?Take 5,000 mcg by mouth every morning. ?  ?Vitamin D-3 125 MCG (5000 UT) Tabs ?Take 10,000 Units by mouth every morning. ?  ?ZINC PO ?Take 1 tablet by mouth at bedtime. ?  ? ?  ? ? ?Discharge Exam: ?Filed Weights  ? 12/15/21 0856  ?Weight: 105.2 kg  ? ?Exam: ? ?Constitutional:  ?The patient is awake, alert, and oriented x 3. No acute distress. ?Respiratory:  ?No increased work of breathing. ?No wheezes, rales, or rhonchi ?No tactile fremitus ?Cardiovascular:  ?Regular rate and rhythm ?No murmurs, ectopy, or gallups. ?No lateral PMI. No thrills. ?Abdomen:  ?Abdomen is soft, non-tender, non-distended ?No hernias, masses, or organomegaly ?Normoactive bowel sounds.  ?Musculoskeletal:  ?No cyanosis, clubbing, or edema ?Skin:  ?No rashes, lesions, ulcers ?palpation of skin: no induration or nodules ?Neurologic:  ?CN 2-12 intact ?Sensation all 4 extremities intact ?Psychiatric:  ?Mental status ?Mood, affect appropriate ?Orientation to person, place, time  ?judgment and insight appear intact ? ? ?Condition at discharge: fair ? ?The results of significant diagnostics from this hospitalization (including imaging, microbiology, ancillary and laboratory) are listed below for reference.  ? ?Imaging Studies: ?CT Abdomen Pelvis W Contrast ? ?Result Date: 12/15/2021 ?CLINICAL DATA:  Abdominal pain, acute, nonlocalized EXAM: CT ABDOMEN AND PELVIS WITH CONTRAST TECHNIQUE: Multidetector CT imaging of the abdomen and pelvis was performed using the standard protocol following bolus administration of intravenous contrast. RADIATION DOSE REDUCTION: This exam was performed according to the departmental dose-optimization program which includes automated exposure control, adjustment of the mA and/or kV according to patient size and/or use of iterative reconstruction technique. CONTRAST:  OMNIPAQUE  IOHEXOL 300 MG/ML  SOLN COMPARISON:  CT abdomen and pelvis from 05/17/2016. FINDINGS: Lower chest: No acute abnormality. Hepatobiliary: Hepatomegaly. Diffuse hypoattenuation of the liver, compatible with hepatic steatosis also seen on prior noncontrast CT. No focal liver lesion. Unremarkable appearance of the gallbladder without evidence of biliary dilation. Pancreas: Unremarkable. No pancreatic ductal dilatation or surrounding inflammatory changes. Spleen: Borderline splenomegaly. Adrenals/Urinary Tract: Adrenal glands are unremarkable. Kidneys are normal, without renal calculi, focal lesion, or hydronephrosis. Bladder is unremarkable. Stomach/Bowel: Mildly dilated small bowel loops in the mid and left aspect of the abdomen, measuring up to 3.4 cm. The small bowel loops gradually transition to decompressed small bowel in the right lower quadrant without convincing transition point. Bowel wall appears to be enhancing normally without evidence of pneumatosis or portal venous gas. Vascular/Lymphatic: Aortic atherosclerosis. Mildly prominent mesenteric lymph nodes. Reproductive: Uterus and bilateral adnexa are unremarkable. Other: No abdominal wall hernia or abnormality. No abdominopelvic ascites. Musculoskeletal: No evidence of acute abnormality. IMPRESSION: 1. Findings concerning for early or partial small bowel obstruction, as detailed above. 2. Mildly prominent mesenteric lymph nodes, nonspecific. 3. Hepatomegaly and hepatic steatosis. 4. Borderline splenomegaly. Electronically Signed   By: Feliberto Harts M.D.   On: 12/15/2021 10:35   ? ?Microbiology: ?Results  for orders placed or performed in visit on 07/31/18  ?Urine Culture     Status: None  ? Collection Time: 07/31/18 10:32 AM  ? Specimen: Urine  ?Result Value Ref Range Status  ? Urine Culture, Routine Final report  Final  ? Organism ID, Bacteria Comment  Final  ?  Comment: Culture shows less than 10,000 colony forming units of bacteria per ?milliliter of  urine. This colony count is not generally considered ?to be clinically significant. ?  ? ? ?Labs: ?CBC: ?Recent Labs  ?Lab 12/15/21 ?0911 12/16/21 ?0446 12/17/21 ?0450  ?WBC 9.4 6.6 6.5  ?NEUTROABS 6.6  --  3.

## 2022-09-25 ENCOUNTER — Ambulatory Visit: Payer: BC Managed Care – PPO | Admitting: Family Medicine

## 2022-09-25 DIAGNOSIS — J209 Acute bronchitis, unspecified: Secondary | ICD-10-CM

## 2022-09-26 ENCOUNTER — Telehealth: Payer: Self-pay | Admitting: Family Medicine

## 2022-09-26 DIAGNOSIS — J0140 Acute pansinusitis, unspecified: Secondary | ICD-10-CM

## 2022-09-26 DIAGNOSIS — J40 Bronchitis, not specified as acute or chronic: Secondary | ICD-10-CM

## 2022-09-26 MED ORDER — AMOXICILLIN-POT CLAVULANATE 875-125 MG PO TABS: 1.0000 | ORAL_TABLET | Freq: Two times a day (BID) | ORAL | 0 refills | Status: AC

## 2022-09-26 MED ORDER — BENZONATATE 100 MG PO CAPS: 100.0000 mg | ORAL_CAPSULE | Freq: Two times a day (BID) | ORAL | 0 refills | Status: AC | PRN

## 2022-09-26 MED ORDER — PREDNISONE 20 MG PO TABS: 20.0000 mg | ORAL_TABLET | Freq: Every day | ORAL | 0 refills | Status: AC

## 2022-09-26 MED ORDER — ALBUTEROL SULFATE HFA 108 (90 BASE) MCG/ACT IN AERS: 2.0000 | INHALATION_SPRAY | Freq: Four times a day (QID) | RESPIRATORY_TRACT | 0 refills | Status: AC | PRN

## 2022-09-26 NOTE — Telephone Encounter (Incomplete)
SUBJECTIVE:  Vickie [name] is a 42 y.o. female who complains of {uri sx:315001} for *** days. She denies a history of {hx resp sx additional:315009} and {has/denies:315300} a history of asthma. Patient {has/denies:315300} smoke cigarettes.   OBJECTIVE: She appears well, vital signs are as noted. Ears normal.  Throat and pharynx normal.  Neck supple. No adenopathy in the neck. Nose is congested. Sinuses non tender. The chest is clear, without wheezes or rales.  ASSESSMENT:  {uri dx:315273::viral upper respiratory illness}  PLAN: Symptomatic therapy suggested: {resp plan:315236}. Lack of antibiotic effectiveness discussed with her. Call or return to clinic prn if these symptoms worsen or fail to improve as anticipated.

## 2022-10-02 ENCOUNTER — Encounter: Payer: Self-pay | Admitting: Family Medicine

## 2022-10-02 NOTE — Progress Notes (Signed)
I connected with  Vila L [name] on 10/02/22 by a video enabled telemedicine application and verified that I am speaking with the correct person using two identifiers. Phone preferred, no MyChart access.   I discussed the limitations of evaluation and management by telemedicine. The patient expressed understanding and agreed to proceed.  New Patient Office Visit  Subjective    Patient ID: Vickie [name], female    DOB: February 08, 1981  Age: 42 y.o. MRN: [id number]  CC:  Chief Complaint  Patient presents with   Bronchitis    HPI Vickie [name] presents to establish care  42 year old female past medical history significant for hypertension calling in today for acute respiratory discomfort.  Patient states she has had URI symptoms for less than 7 days time.  Denies any fever the last 24 hours.  Has some cough and mild shortness of breath.  Cough is productive.  Unable to specify color of the sputum.  No nausea vomiting diarrhea.  Multiple sick contacts.  Recent COVID booster.  Patient has no history of pulmonary disease such as COPD or asthma.  Outpatient Encounter Medications as of 09/25/2022  Medication Sig   acetaminophen (TYLENOL) 500 MG tablet Take 1,000 mg by mouth 2 (two) times daily as needed (pain).   aspirin EC 81 MG tablet Take 81 mg by mouth at bedtime. Swallow whole.   Cholecalciferol (VITAMIN D-3) 125 MCG (5000 UT) TABS Take 10,000 Units by mouth every morning.   Cyanocobalamin (VITAMIN B-12) 5000 MCG TBDP Take 5,000 mcg by mouth every morning.   hydrochlorothiazide (HYDRODIURIL) 12.5 MG tablet TAKE 1 TABLET BY MOUTH DAILY. **NEEDS APT FOR REFILLS** (Patient taking differently: Take 12.5 mg by mouth every morning.)   hyoscyamine (ANASPAZ) 0.125 MG TBDP disintergrating tablet Place 0.125 mg under the tongue daily as needed (nausea).   ibuprofen (ADVIL) 200 MG tablet Take 400 mg by mouth once.   losartan (COZAAR) 100 MG tablet Take 0.5 tablets (50  mg total) by mouth daily. (Patient taking differently: Take 50 mg by mouth every morning.)   metFORMIN (GLUCOPHAGE) 500 MG tablet TAKE 1 TABLET (500 MG TOTAL) BY MOUTH 2 (TWO) TIMES DAILY WITH A MEAL. **NEEDS APT FOR REFILLS** (Patient taking differently: Take 500 mg by mouth 2 (two) times daily.)   Methylcellulose, Laxative, (FIBER THERAPY PO) Take 1 tablet by mouth at bedtime.   Multiple Vitamin (MULTIVITAMIN WITH MINERALS) TABS tablet Take 2 tablets by mouth every morning.   Multiple Vitamins-Minerals (ZINC PO) Take 1 tablet by mouth at bedtime.   pantoprazole (PROTONIX) 40 MG tablet TAKE 1 TABLET BY MOUTH EVERY DAY (Patient taking differently: 40 mg every morning.)   No facility-administered encounter medications on file as of 09/25/2022.    Past Medical History:  Diagnosis Date   Abnormal Pap smear    Anxiety    Broken arm    Left    Complication of anesthesia    Epidural- baby's HR decreased ; developed  fever   Fatigue 2008   H/O dyspareunia 01/23/11   H/O varicella    Hx of candidiasis    Hx: UTI (urinary tract infection)    Hypertension    Increased BMI    Irregular periods/menstrual cycles 2008   Postpartum depression    Vaginal lesion 2009    Painful   Vaginal pain 01/23/11    Past Surgical History:  Procedure Laterality Date   repair broken arm     TONSILLECTOMY     Age 65   TUBAL LIGATION  WISDOM TOOTH EXTRACTION      Family History  Problem Relation Age of Onset   Hypertension Mother    Diabetes Mother    Cancer Mother        skin   Leukemia Mother    Diabetes Father    Hypertension Father    COPD Father    Stroke Maternal Grandmother     Social History   Socioeconomic History   Marital status: Married    Spouse name: Not on file   Number of children: Not on file   Years of education: Not on file   Highest education level: Not on file  Occupational History   Not on file  Tobacco Use   Smoking status: Never   Smokeless tobacco: Never   Vaping Use   Vaping Use: Never used  Substance and Sexual Activity   Alcohol use: Yes    Alcohol/week: 1.0 standard drink of alcohol    Types: 1 Standard drinks or equivalent per week    Comment: social   Drug use: No   Sexual activity: Never    Partners: Male    Comment: BTL  Other Topics Concern   Not on file  Social History Narrative   Not on file   Social Determinants of Health   Financial Resource Strain: Not on file  Food Insecurity: Not on file  Transportation Needs: Not on file  Physical Activity: Not on file  Stress: Not on file  Social Connections: Not on file  Intimate Partner Violence: Not on file    Review of Systems  All other systems reviewed and are negative.       Objective    There were no vitals taken for this visit.  Physical Exam Constitutional:      General: She is not in acute distress. Pulmonary:     Effort: Pulmonary effort is normal. No respiratory distress.     Breath sounds: Normal breath sounds. No wheezing.  Neurological:     Mental Status: She is alert and oriented to person, place, and time.  Psychiatric:        Mood and Affect: Mood normal.        Behavior: Behavior normal.         Assessment & Plan:   Problem List Items Addressed This Visit   None Visit Diagnoses     Acute bronchitis, unspecified organism    -  Primary      -Patient with viral illness that has turned into bronchitis she will look into testing herself for COVID tomorrow using a home test-based on the left, her symptoms will give her a prescription for Augmentin and give her albuterol for shortness of breath, she knows how to use an inhaler also will help with her cough with Ladona Ridgel patient will follow-up as needed  No follow-ups on file.   Elwin Mocha, MD

## 2022-11-20 ENCOUNTER — Ambulatory Visit: Payer: BC Managed Care – PPO | Admitting: Family Medicine

## 2022-11-20 DIAGNOSIS — J019 Acute sinusitis, unspecified: Secondary | ICD-10-CM

## 2022-11-23 ENCOUNTER — Encounter: Payer: Self-pay | Admitting: Family Medicine

## 2022-11-23 MED ORDER — PREDNISONE 20 MG PO TABS: 40.0000 mg | ORAL_TABLET | Freq: Every day | ORAL | 0 refills | Status: AC

## 2022-11-23 MED ORDER — AZITHROMYCIN 250 MG PO TABS: ORAL_TABLET | ORAL | 0 refills | Status: AC

## 2022-11-23 NOTE — Progress Notes (Signed)
[facility] Telemed Visit. Pt given other options but choose phone.  SUBJECTIVE:  Vickie [name] is a 42 y.o. female who complains of congestion, dry cough, and generalized sinus pain for 1 days. She denies a history of anorexia, chest pain, dizziness, nausea, and shortness of breath and denies a history of asthma. Patient denies smoke cigarettes.   OBJECTIVE: Nl WOB No audble wheeze A&Ox3 Nl mood& affect  ASSESSMENT:  viral upper respiratory illness and sinusitis  PLAN: Symptomatic therapy suggested: push fluids, rest, return office visit prn if symptoms persist or worsen, and Zpak. Prednisone 5 d 40mg  called in by nurse. Call or return to clinic prn if these symptoms worsen or fail to improve as anticipated.  Elwin Mocha, MD 11/20/22

## 2022-12-19 ENCOUNTER — Other Ambulatory Visit: Payer: Self-pay | Admitting: Family Medicine

## 2022-12-19 DIAGNOSIS — Z1231 Encounter for screening mammogram for malignant neoplasm of breast: Secondary | ICD-10-CM

## 2023-05-16 ENCOUNTER — Telehealth: Payer: Self-pay

## 2023-05-16 NOTE — Telephone Encounter (Signed)
Returned patient's call. Patient ineligible after answering screening questions for mammography scholarship application.

## 2023-05-16 NOTE — Telephone Encounter (Signed)
Telephoned patient at mobile number. Left a voice message with [facility] (scholarship) contact information. 

## 2024-03-17 ENCOUNTER — Telehealth: Payer: Self-pay | Admitting: *Deleted

## 2024-03-17 NOTE — Telephone Encounter (Signed)
 LVM and informed patient that she would need to contact the records department to retrieve her medical record. Provided the number and told her to call us  if she had any questions. Will also try to send her a mychart message as well.

## 2024-03-17 NOTE — Telephone Encounter (Signed)
 Copied from CRM [id number]. Topic: Medical Record Request - Records Request >> Mar 14, 2024  2:03 PM Charlet HERO wrote: Reason for CRM: patient is calling to request her medical records printed out for herself. She would like to have a call when they are ready for pick up
